# Patient Record
Sex: Male | Born: 1965 | Race: White | Hispanic: No | Smoking: Former smoker
Health system: Southern US, Community
[De-identification: ages and names within clinical notes are randomized; demographics above are authoritative.]

## PROBLEM LIST (undated history)

## (undated) DIAGNOSIS — K219 Gastro-esophageal reflux disease without esophagitis: Secondary | ICD-10-CM

## (undated) DIAGNOSIS — K602 Anal fissure, unspecified: Secondary | ICD-10-CM

## (undated) DIAGNOSIS — F1020 Alcohol dependence, uncomplicated: Secondary | ICD-10-CM

## (undated) DIAGNOSIS — J189 Pneumonia, unspecified organism: Secondary | ICD-10-CM

## (undated) DIAGNOSIS — F419 Anxiety disorder, unspecified: Secondary | ICD-10-CM

## (undated) DIAGNOSIS — G44009 Cluster headache syndrome, unspecified, not intractable: Secondary | ICD-10-CM

## (undated) DIAGNOSIS — K2270 Barrett's esophagus without dysplasia: Secondary | ICD-10-CM

## (undated) DIAGNOSIS — I10 Essential (primary) hypertension: Secondary | ICD-10-CM

## (undated) DIAGNOSIS — E538 Deficiency of other specified B group vitamins: Secondary | ICD-10-CM

## (undated) HISTORY — DX: Cluster headache syndrome, unspecified, not intractable: G44.009

## (undated) HISTORY — DX: Essential (primary) hypertension: I10

## (undated) HISTORY — PX: COCHLEAR IMPLANT: SUR684

## (undated) HISTORY — DX: Anal fissure, unspecified: K60.2

## (undated) HISTORY — DX: Anxiety disorder, unspecified: F41.9

## (undated) HISTORY — DX: Pneumonia, unspecified organism: J18.9

## (undated) HISTORY — DX: Gastro-esophageal reflux disease without esophagitis: K21.9

## (undated) HISTORY — PX: TONSILLECTOMY: SUR1361

## (undated) HISTORY — PX: HEMORRHOID BANDING: SHX5850

## (undated) HISTORY — DX: Deficiency of other specified B group vitamins: E53.8

## (undated) HISTORY — PX: OTHER SURGICAL HISTORY: SHX169

## (undated) HISTORY — DX: Barrett's esophagus without dysplasia: K22.70

## (undated) HISTORY — DX: Alcohol dependence, uncomplicated: F10.20

## (undated) HISTORY — PX: UPPER GASTROINTESTINAL ENDOSCOPY: SHX188

---

## 2003-03-23 ENCOUNTER — Other Ambulatory Visit: Payer: Self-pay

## 2004-07-21 ENCOUNTER — Emergency Department: Payer: Self-pay | Admitting: Unknown Physician Specialty

## 2004-12-18 ENCOUNTER — Ambulatory Visit: Payer: Self-pay | Admitting: Internal Medicine

## 2005-01-05 ENCOUNTER — Ambulatory Visit: Payer: Self-pay | Admitting: Internal Medicine

## 2005-05-10 ENCOUNTER — Encounter: Payer: Self-pay | Admitting: Family Medicine

## 2005-05-10 ENCOUNTER — Ambulatory Visit: Payer: Self-pay | Admitting: Internal Medicine

## 2005-06-22 ENCOUNTER — Ambulatory Visit: Payer: Self-pay | Admitting: Internal Medicine

## 2005-09-22 ENCOUNTER — Observation Stay (HOSPITAL_COMMUNITY): Admission: EM | Admit: 2005-09-22 | Discharge: 2005-09-24 | Payer: Self-pay | Admitting: Emergency Medicine

## 2005-09-22 ENCOUNTER — Ambulatory Visit: Payer: Self-pay | Admitting: Cardiology

## 2005-09-24 ENCOUNTER — Inpatient Hospital Stay (HOSPITAL_COMMUNITY): Admission: AD | Admit: 2005-09-24 | Discharge: 2005-09-27 | Payer: Self-pay | Admitting: Psychiatry

## 2005-09-25 ENCOUNTER — Ambulatory Visit: Payer: Self-pay | Admitting: Psychiatry

## 2005-10-26 ENCOUNTER — Ambulatory Visit: Payer: Self-pay | Admitting: Internal Medicine

## 2005-10-31 ENCOUNTER — Emergency Department (HOSPITAL_COMMUNITY): Admission: EM | Admit: 2005-10-31 | Discharge: 2005-10-31 | Payer: Self-pay | Admitting: Emergency Medicine

## 2005-11-10 ENCOUNTER — Ambulatory Visit: Payer: Self-pay | Admitting: Internal Medicine

## 2005-11-29 ENCOUNTER — Ambulatory Visit: Payer: Self-pay | Admitting: Internal Medicine

## 2005-12-01 ENCOUNTER — Ambulatory Visit: Payer: Self-pay | Admitting: Internal Medicine

## 2006-03-04 ENCOUNTER — Ambulatory Visit: Payer: Self-pay | Admitting: Internal Medicine

## 2006-04-28 ENCOUNTER — Ambulatory Visit: Payer: Self-pay | Admitting: Internal Medicine

## 2006-05-20 ENCOUNTER — Ambulatory Visit: Payer: Self-pay

## 2006-05-23 ENCOUNTER — Ambulatory Visit: Payer: Self-pay | Admitting: Internal Medicine

## 2006-09-27 ENCOUNTER — Ambulatory Visit: Payer: Self-pay | Admitting: Internal Medicine

## 2006-10-05 ENCOUNTER — Ambulatory Visit (HOSPITAL_COMMUNITY): Admission: RE | Admit: 2006-10-05 | Discharge: 2006-10-06 | Payer: Self-pay | Admitting: Otolaryngology

## 2006-11-03 DIAGNOSIS — F410 Panic disorder [episodic paroxysmal anxiety] without agoraphobia: Secondary | ICD-10-CM

## 2006-11-03 DIAGNOSIS — F1020 Alcohol dependence, uncomplicated: Secondary | ICD-10-CM

## 2006-11-03 DIAGNOSIS — K219 Gastro-esophageal reflux disease without esophagitis: Secondary | ICD-10-CM

## 2006-11-03 DIAGNOSIS — F411 Generalized anxiety disorder: Secondary | ICD-10-CM | POA: Insufficient documentation

## 2006-11-03 DIAGNOSIS — G510 Bell's palsy: Secondary | ICD-10-CM

## 2007-04-26 ENCOUNTER — Telehealth (INDEPENDENT_AMBULATORY_CARE_PROVIDER_SITE_OTHER): Payer: Self-pay | Admitting: *Deleted

## 2007-04-27 ENCOUNTER — Ambulatory Visit: Payer: Self-pay | Admitting: Internal Medicine

## 2007-04-27 DIAGNOSIS — G252 Other specified forms of tremor: Secondary | ICD-10-CM

## 2007-04-27 DIAGNOSIS — J209 Acute bronchitis, unspecified: Secondary | ICD-10-CM

## 2007-04-27 DIAGNOSIS — G250 Essential tremor: Secondary | ICD-10-CM

## 2007-04-27 DIAGNOSIS — H9190 Unspecified hearing loss, unspecified ear: Secondary | ICD-10-CM | POA: Insufficient documentation

## 2007-05-04 LAB — CONVERTED CEMR LAB
BUN: 5 mg/dL — ABNORMAL LOW (ref 6–23)
CO2: 36 meq/L — ABNORMAL HIGH (ref 19–32)
Calcium: 9.7 mg/dL (ref 8.4–10.5)
Creatinine, Ser: 1.2 mg/dL (ref 0.4–1.5)
GFR calc Af Amer: 86 mL/min
Potassium: 4 meq/L (ref 3.5–5.1)
Sodium: 135 meq/L (ref 135–145)
TSH: 1.94 microintl units/mL (ref 0.35–5.50)

## 2007-06-08 ENCOUNTER — Telehealth: Payer: Self-pay | Admitting: Family Medicine

## 2007-08-01 ENCOUNTER — Encounter: Payer: Self-pay | Admitting: Internal Medicine

## 2007-09-18 ENCOUNTER — Emergency Department (HOSPITAL_COMMUNITY): Admission: EM | Admit: 2007-09-18 | Discharge: 2007-09-18 | Payer: Self-pay | Admitting: Emergency Medicine

## 2007-09-19 ENCOUNTER — Inpatient Hospital Stay (HOSPITAL_COMMUNITY): Admission: AD | Admit: 2007-09-19 | Discharge: 2007-09-22 | Payer: Self-pay | Admitting: Psychiatry

## 2007-09-19 ENCOUNTER — Ambulatory Visit: Payer: Self-pay | Admitting: Psychiatry

## 2007-09-25 ENCOUNTER — Other Ambulatory Visit (HOSPITAL_COMMUNITY): Admission: RE | Admit: 2007-09-25 | Discharge: 2007-11-09 | Payer: Self-pay | Admitting: Psychiatry

## 2007-12-27 ENCOUNTER — Encounter: Payer: Self-pay | Admitting: Internal Medicine

## 2008-01-22 ENCOUNTER — Ambulatory Visit: Payer: Self-pay | Admitting: Internal Medicine

## 2008-01-22 DIAGNOSIS — J189 Pneumonia, unspecified organism: Secondary | ICD-10-CM

## 2008-01-22 HISTORY — DX: Pneumonia, unspecified organism: J18.9

## 2008-01-26 ENCOUNTER — Telehealth: Payer: Self-pay | Admitting: Internal Medicine

## 2008-02-02 ENCOUNTER — Encounter: Payer: Self-pay | Admitting: Internal Medicine

## 2008-02-13 ENCOUNTER — Ambulatory Visit: Payer: Self-pay | Admitting: Internal Medicine

## 2008-02-13 DIAGNOSIS — I10 Essential (primary) hypertension: Secondary | ICD-10-CM

## 2008-07-31 ENCOUNTER — Ambulatory Visit: Payer: Self-pay | Admitting: Internal Medicine

## 2008-07-31 ENCOUNTER — Telehealth: Payer: Self-pay | Admitting: Internal Medicine

## 2008-07-31 DIAGNOSIS — J309 Allergic rhinitis, unspecified: Secondary | ICD-10-CM | POA: Insufficient documentation

## 2008-09-06 ENCOUNTER — Telehealth: Payer: Self-pay | Admitting: Internal Medicine

## 2008-09-06 ENCOUNTER — Ambulatory Visit: Payer: Self-pay | Admitting: Internal Medicine

## 2008-09-19 ENCOUNTER — Emergency Department (HOSPITAL_COMMUNITY): Admission: EM | Admit: 2008-09-19 | Discharge: 2008-09-19 | Payer: Self-pay | Admitting: Emergency Medicine

## 2008-10-21 ENCOUNTER — Encounter: Payer: Self-pay | Admitting: Internal Medicine

## 2008-10-25 ENCOUNTER — Encounter: Payer: Self-pay | Admitting: Internal Medicine

## 2008-10-28 ENCOUNTER — Telehealth: Payer: Self-pay | Admitting: Internal Medicine

## 2008-11-17 ENCOUNTER — Emergency Department (HOSPITAL_COMMUNITY): Admission: EM | Admit: 2008-11-17 | Discharge: 2008-11-17 | Payer: Self-pay | Admitting: Emergency Medicine

## 2008-11-18 ENCOUNTER — Telehealth: Payer: Self-pay | Admitting: Internal Medicine

## 2008-11-19 ENCOUNTER — Ambulatory Visit: Payer: Self-pay | Admitting: Internal Medicine

## 2008-11-19 DIAGNOSIS — R21 Rash and other nonspecific skin eruption: Secondary | ICD-10-CM | POA: Insufficient documentation

## 2008-11-19 DIAGNOSIS — M79609 Pain in unspecified limb: Secondary | ICD-10-CM | POA: Insufficient documentation

## 2009-01-28 ENCOUNTER — Telehealth: Payer: Self-pay | Admitting: Internal Medicine

## 2009-01-31 ENCOUNTER — Telehealth: Payer: Self-pay | Admitting: Internal Medicine

## 2009-02-04 ENCOUNTER — Telehealth: Payer: Self-pay | Admitting: Internal Medicine

## 2009-04-30 ENCOUNTER — Ambulatory Visit: Payer: Self-pay | Admitting: Internal Medicine

## 2009-04-30 DIAGNOSIS — Z87891 Personal history of nicotine dependence: Secondary | ICD-10-CM | POA: Insufficient documentation

## 2009-04-30 DIAGNOSIS — G5600 Carpal tunnel syndrome, unspecified upper limb: Secondary | ICD-10-CM

## 2009-04-30 DIAGNOSIS — R209 Unspecified disturbances of skin sensation: Secondary | ICD-10-CM | POA: Insufficient documentation

## 2009-05-29 ENCOUNTER — Encounter: Payer: Self-pay | Admitting: Internal Medicine

## 2009-07-02 ENCOUNTER — Telehealth: Payer: Self-pay | Admitting: Internal Medicine

## 2009-07-03 ENCOUNTER — Telehealth: Payer: Self-pay | Admitting: Internal Medicine

## 2009-08-01 ENCOUNTER — Ambulatory Visit: Payer: Self-pay | Admitting: Internal Medicine

## 2009-08-07 ENCOUNTER — Telehealth: Payer: Self-pay | Admitting: Internal Medicine

## 2009-09-17 ENCOUNTER — Telehealth (INDEPENDENT_AMBULATORY_CARE_PROVIDER_SITE_OTHER): Payer: Self-pay | Admitting: *Deleted

## 2009-09-19 ENCOUNTER — Ambulatory Visit: Payer: Self-pay | Admitting: Internal Medicine

## 2009-09-19 DIAGNOSIS — R631 Polydipsia: Secondary | ICD-10-CM

## 2009-09-19 DIAGNOSIS — E538 Deficiency of other specified B group vitamins: Secondary | ICD-10-CM

## 2009-09-19 LAB — CONVERTED CEMR LAB
Blood Glucose, Fingerstick: 106
Vit D, 25-Hydroxy: 39 ng/mL (ref 30–89)

## 2009-09-22 ENCOUNTER — Telehealth: Payer: Self-pay | Admitting: Internal Medicine

## 2009-09-22 LAB — CONVERTED CEMR LAB
ALT: 33 units/L (ref 0–53)
BUN: 14 mg/dL (ref 6–23)
Bilirubin Urine: NEGATIVE
CO2: 29 meq/L (ref 19–32)
Calcium: 9.2 mg/dL (ref 8.4–10.5)
Creatinine, Ser: 1 mg/dL (ref 0.4–1.5)
Eosinophils Absolute: 0.5 10*3/uL (ref 0.0–0.7)
Eosinophils Relative: 5 % (ref 0.0–5.0)
Glucose, Bld: 104 mg/dL — ABNORMAL HIGH (ref 70–99)
Leukocytes, UA: NEGATIVE
Lymphocytes Relative: 35.9 % (ref 12.0–46.0)
MCV: 97.6 fL (ref 78.0–100.0)
Nitrite: NEGATIVE
Platelets: 269 10*3/uL (ref 150.0–400.0)
Potassium: 3.8 meq/L (ref 3.5–5.1)
RDW: 12.9 % (ref 11.5–14.6)
Specific Gravity, Urine: >=1.03 (ref 1.000–1.030)
Total Protein: 8.1 g/dL (ref 6.0–8.3)
Urine Glucose: NEGATIVE mg/dL
pH: 6 (ref 5.0–8.0)

## 2009-09-24 ENCOUNTER — Telehealth: Payer: Self-pay | Admitting: Internal Medicine

## 2009-10-17 ENCOUNTER — Emergency Department (HOSPITAL_COMMUNITY): Admission: EM | Admit: 2009-10-17 | Discharge: 2009-10-18 | Payer: Self-pay | Admitting: Emergency Medicine

## 2009-10-20 ENCOUNTER — Telehealth: Payer: Self-pay | Admitting: Internal Medicine

## 2009-10-20 DIAGNOSIS — M25529 Pain in unspecified elbow: Secondary | ICD-10-CM

## 2009-10-21 ENCOUNTER — Encounter: Payer: Self-pay | Admitting: Internal Medicine

## 2009-10-28 ENCOUNTER — Ambulatory Visit: Payer: Self-pay | Admitting: Internal Medicine

## 2009-11-05 ENCOUNTER — Encounter: Payer: Self-pay | Admitting: Internal Medicine

## 2010-01-20 ENCOUNTER — Telehealth: Payer: Self-pay | Admitting: Internal Medicine

## 2010-02-03 ENCOUNTER — Telehealth: Payer: Self-pay | Admitting: Internal Medicine

## 2010-03-11 ENCOUNTER — Telehealth: Payer: Self-pay | Admitting: Internal Medicine

## 2010-03-11 ENCOUNTER — Encounter (INDEPENDENT_AMBULATORY_CARE_PROVIDER_SITE_OTHER): Payer: Self-pay | Admitting: *Deleted

## 2010-03-27 ENCOUNTER — Ambulatory Visit: Payer: Self-pay | Admitting: Internal Medicine

## 2010-04-03 ENCOUNTER — Encounter: Payer: Self-pay | Admitting: Internal Medicine

## 2010-04-03 ENCOUNTER — Ambulatory Visit: Payer: Self-pay | Admitting: Internal Medicine

## 2010-04-13 ENCOUNTER — Encounter: Payer: Self-pay | Admitting: Internal Medicine

## 2010-04-13 ENCOUNTER — Other Ambulatory Visit: Payer: Self-pay | Admitting: Internal Medicine

## 2010-04-13 LAB — LDL CHOLESTEROL, DIRECT: Direct LDL: 156.5 mg/dL

## 2010-04-13 LAB — CBC WITH DIFFERENTIAL/PLATELET
Basophils Absolute: 0 10*3/uL (ref 0.0–0.1)
Basophils Relative: 0.5 % (ref 0.0–3.0)
Eosinophils Absolute: 0.5 10*3/uL (ref 0.0–0.7)
Eosinophils Relative: 7.6 % — ABNORMAL HIGH (ref 0.0–5.0)
HCT: 44.9 % (ref 39.0–52.0)
Hemoglobin: 15.6 g/dL (ref 13.0–17.0)
Lymphocytes Relative: 31.5 % (ref 12.0–46.0)
Lymphs Abs: 2.2 10*3/uL (ref 0.7–4.0)
MCHC: 34.7 g/dL (ref 30.0–36.0)
MCV: 99.2 fl (ref 78.0–100.0)
Monocytes Absolute: 0.9 10*3/uL (ref 0.1–1.0)
Monocytes Relative: 12.3 % — ABNORMAL HIGH (ref 3.0–12.0)
Neutro Abs: 3.4 10*3/uL (ref 1.4–7.7)
Neutrophils Relative %: 48.1 % (ref 43.0–77.0)
Platelets: 283 10*3/uL (ref 150.0–400.0)
RBC: 4.52 Mil/uL (ref 4.22–5.81)
RDW: 12.3 % (ref 11.5–14.6)
WBC: 7.1 10*3/uL (ref 4.5–10.5)

## 2010-04-13 LAB — LIPID PANEL
Cholesterol: 229 mg/dL — ABNORMAL HIGH (ref 0–200)
HDL: 60.8 mg/dL (ref >39.00)
Total CHOL/HDL Ratio: 4
Triglycerides: 160 mg/dL — ABNORMAL HIGH (ref 0.0–149.0)
VLDL: 32 mg/dL (ref 0.0–40.0)

## 2010-04-13 LAB — BASIC METABOLIC PANEL
BUN: 13 mg/dL (ref 6–23)
CO2: 28 mEq/L (ref 19–32)
Calcium: 9.3 mg/dL (ref 8.4–10.5)
Chloride: 103 mEq/L (ref 96–112)
Creatinine, Ser: 0.9 mg/dL (ref 0.4–1.5)
GFR: 96.64 mL/min (ref >60.00)
Glucose, Bld: 87 mg/dL (ref 70–99)
Potassium: 4.4 mEq/L (ref 3.5–5.1)
Sodium: 139 mEq/L (ref 135–145)

## 2010-04-13 LAB — HEPATIC FUNCTION PANEL
ALT: 28 U/L (ref 0–53)
AST: 22 U/L (ref 0–37)
Albumin: 3.9 g/dL (ref 3.5–5.2)
Alkaline Phosphatase: 64 U/L (ref 39–117)
Bilirubin, Direct: 0.1 mg/dL (ref 0.0–0.3)
Total Bilirubin: 0.8 mg/dL (ref 0.3–1.2)
Total Protein: 7.8 g/dL (ref 6.0–8.3)

## 2010-04-13 LAB — TSH: TSH: 1.36 u[IU]/mL (ref 0.35–5.50)

## 2010-04-13 LAB — URINALYSIS
Bilirubin Urine: NEGATIVE
Hemoglobin, Urine: NEGATIVE
Ketones, ur: NEGATIVE
Leukocytes, UA: NEGATIVE
Nitrite: NEGATIVE
Specific Gravity, Urine: 1.015 (ref 1.000–1.030)
Total Protein, Urine: NEGATIVE
Urine Glucose: NEGATIVE
Urobilinogen, UA: 0.2 (ref 0.0–1.0)
pH: 7.5 (ref 5.0–8.0)

## 2010-04-13 LAB — VITAMIN B12: Vitamin B-12: 563 pg/mL (ref 211–911)

## 2010-04-13 LAB — PSA: PSA: 0.4 ng/mL (ref 0.10–4.00)

## 2010-04-30 ENCOUNTER — Telehealth: Payer: Self-pay | Admitting: Internal Medicine

## 2010-05-05 NOTE — Progress Notes (Signed)
  Phone Note Call from Patient Call back at Work Phone [phone]   Summary of Call: Pt has hemorrhoids and otc cream says to ask MD b/c he has HTN. Please advise.  Initial call taken by: Lamar Sprinkles, CMA,  Aug 07, 2009 2:21 PM  Follow-up for Phone Call        OK to use cream OV if bad Follow-up by: Tresa Garter MD,  Aug 07, 2009 4:06 PM  Additional Follow-up for Phone Call Additional follow up Details #1::        left mess to call office back................Marland KitchenLamar Sprinkles, CMA  Aug 07, 2009 4:08 PM     Additional Follow-up for Phone Call Additional follow up Details #2::    pt informed that per MD it is okay to use OTC cream and if it does not help or sxs get worse, contact our office for an appt. Follow-up by: Margaret Pyle, CMA,  Aug 08, 2009 8:59 AM

## 2010-05-05 NOTE — Progress Notes (Signed)
Summary: NEEDS OV  ---- Converted from flag ---- ---- 03/11/2010 10:41 AM, Verdell Face wrote: mailed letter.  ---- 03/02/2010 11:14 AM, Verdell Face wrote: left message to call for appt.  ---- 03/02/2010 10:05 AM, Lanier Prude, CMA(AAMA) wrote: Please sched pt OV with PCP.   Thanks!! ------------------------------

## 2010-05-05 NOTE — Letter (Signed)
Summary: Donald Prose MD  Donald Prose MD   Imported By: Lester Logan [place] 11/18/2009 09:17:19  _____________________________________________________________________  External Attachment:    Type:   Image     Comment:   External Document

## 2010-05-05 NOTE — Progress Notes (Signed)
Summary: azor  Phone Note Call from Patient Call back at [employer] [phone] Call back at Work Phone [phone]   Summary of Call: Patient left message on triage that insurance will not cover Azor, send 90 day supply to his pharmacy. I left message on voicemail at work to call the office with more info--?need prior authorization, im sure that if they wont pay for 30day then they wont pay for 90 day. Initial call taken by: Lucious Groves,  July 03, 2009 3:32 PM  Follow-up for Phone Call        lmovm for pt to call office for more information. Follow-up by: Ami Bullins CMA,  July 04, 2009 9:26 AM  Additional Follow-up for Phone Call Additional follow up Details #1::        Pt called back, he needs bp med [facility] Additional Follow-up by: Lamar Sprinkles, CMA,  July 04, 2009 11:04 AM    Prescriptions: AZOR 10-40 MG TABS (AMLODIPINE-OLMESARTAN) 1 by mouth once daily for blood pressure  #90 x 3   Entered by:   Lamar Sprinkles, CMA   Authorized by:   Tresa Garter MD   Signed by:   Lamar Sprinkles, CMA on 07/04/2009   Method used:   [facility] to        [facility] [address] [phone]* (retail)       [address]       [place], [place]  [address]       Ph: [phone] or [phone]       Fax: [phone]   RxID:   [id number]   Appended Document: azor Rtn call to patient, Left message on voicemail notifying patient prescription has been corrected.

## 2010-05-05 NOTE — Progress Notes (Signed)
Summary: Anxiety  Phone Note Other Incoming   Caller: pt  Summary of Call: Pt calling and states that his anxiety level is still about the same. He states that he takes 1/2 alprazalom two times a day (he cannot take a whole while he is working) What do you advise? Initial call taken by: Ami Bullins CMA,  September 24, 2009 1:48 PM  Follow-up for Phone Call        It could be a sinus infection - take Zpac Follow-up by: Tresa Garter MD,  September 24, 2009 5:16 PM  Additional Follow-up for Phone Call Additional follow up Details #1::        ?what does z-pak have to do with pt anxiety? Called [phone], and was notified that the office has the wrong number. Left message on voicemail at work to call back to office.  I'm sorry, z-pak for anxiety? MD pleease advise.  Additional Follow-up by: Lucious Groves,  September 25, 2009 3:27 PM    Additional Follow-up for Phone Call Additional follow up Details #2::    Zpac is for sinusitis that may be responsible for fatigue that he was here for last time He can try Buspar for anxiety Follow-up by: Tresa Garter MD,  September 26, 2009 12:41 PM  Additional Follow-up for Phone Call Additional follow up Details #3:: Details for Additional Follow-up Action Taken: Patient notfiied. Additional Follow-up by: Lucious Groves,  September 26, 2009 2:22 PM  New/Updated Medications: ZITHROMAX Z-PAK 250 MG TABS (AZITHROMYCIN) as dirrected BUSPIRONE HCL 15 MG TABS (BUSPIRONE HCL) Start with 1/2 tab two times a day then 1 by mouth two times a day for anxiety  Prescriptions: BUSPIRONE HCL 15 MG TABS (BUSPIRONE HCL) Start with 1/2 tab two times a day then 1 by mouth two times a day for anxiety  #60 x 6   Entered and Authorized by:   Tresa Garter MD   Signed by:   Lucious Groves on 09/26/2009   Method used:   Electronically to        [facility] [address] [phone]* (retail)       [address]       [place], [place]  [address]       Ph:  [phone] or [phone]       Fax: [phone]   RxID:   [id number] ZITHROMAX Z-PAK 250 MG TABS (AZITHROMYCIN) as dirrected  #1 x 0   Entered by:   Lucious Groves   Authorized by:   Tresa Garter MD   Signed by:   Lucious Groves on 09/25/2009   Method used:   Electronically to        [facility]  [facility] [address] [phone]* (retail)       [address]       [place], [place]  [address]       Ph: [phone] or [phone]       Fax: [phone]   RxID:   [id number]

## 2010-05-05 NOTE — Progress Notes (Signed)
Summary: Buspirone 90 day supply  Phone Note From Pharmacy Message from:  Fax from Pharmacy  Call[facility] [address] [phone]* Summary of Call: rec fax request from pharm requesting   Buspirone Hcl 15mg  1 tablet two times a day for a 90 day supply.  This med was just RF in 09/2009 with 6 additional Rf.  Please advise on 90 days supply Initial call taken by: Lanier Prude, [facility]),  February 03, 2010 4:42 PM  Follow-up for Phone Call        ok 90 d Follow-up by: Tresa Garter MD,  February 03, 2010 5:20 PM    New/Updated Medications: BUSPIRONE HCL 15 MG TABS (BUSPIRONE HCL) 1 by mouth two times a day for anxiety Prescriptions: BUSPIRONE HCL 15 MG TABS (BUSPIRONE HCL) 1 by mouth two times a day for anxiety  #180 x 3   Entered and Authorized by:   Tresa Garter MD   Signed by:   Lanier Prude, [facility]) on 02/04/2010   Method used:   Electronically to        [facility]  [facility] [address] [phone]* (retail)       [address]       [place], [place]  [address]       Ph: [phone] or [phone]       Fax: [phone]   RxID:   [id number]

## 2010-05-05 NOTE — Assessment & Plan Note (Signed)
Summary: NUMBNESS IN HAND WHEN WAKES UP/ SOB / ANXIETY/ NWS   Vital Signs:  Patient profile:   45 year old male Weight:      204 pounds BMI:     31.13 Temp:     98.3 degrees F oral Pulse rate:   83 / minute BP sitting:   156 / 96  (left arm)  Vitals Entered By: Tora Perches (April 30, 2009 1:16 PM) CC: numbness in hand, sob, anxiety Is Patient Diabetic? No   CC:  numbness in hand, sob, and anxiety.  History of Present Illness: C/o anxiety and panic attaks C/o pain in chest and neck C/O NUMBNESS IN L HAND WHEN GOING TO SLEEP  Preventive Screening-Counseling & Management  Alcohol-Tobacco     Smoking Status: quit  Allergies: 1)  ! Prozac (Fluoxetine Hcl) 2)  ! Nexium (Esomeprazole Magnesium) 3)  ! Prilosec (Omeprazole)  Past History:  Past Medical History: Last updated: 11/19/2008 Anxiety w/panic attacks GERD Alcoholism Hypertension Allergic rhinitis H zoster (?) L arm 2010  Past Surgical History: Last updated: 04/27/2007 Cochlear implant   Family History: Last updated: 04/27/2007 Family History Hypertension  Social History: Last updated: 07/31/2008 Occupation:[employer] Single separated Former Smoker Alcohol use-yes  Physical Exam  General:  NAD  Nose:  nasal dischargemucosal pallor and mucosal erythema.   Mouth:  Erythematous throat mucosa and intranasal erythema.  Neck:  supple and full ROM.   Lungs:  CTA Heart:  tachy Abdomen:  Bowel sounds positive,abdomen soft and non-tender without masses, organomegaly or hernias noted. Msk:  Hypersensitive ove prox R lat shoulder Extremities:  no edema, no ulcers  Neurologic:  Mild tremor present in UE CTS signs (-) Psych:  Oriented X3 and slightly anxious.     Impression & Recommendations:  Problem # 1:  CARPAL TUNNEL SYNDROME (ICD-354.0) L Assessment New  Orders: Splints- All Types (Z6109)  Problem # 2:  PARESTHESIA (ICD-782.0) L due to #1 Assessment: New  Problem # 3:  HYPERTENSION  (ICD-401.9) Assessment: Deteriorated  His updated medication list for this problem includes:    Azor 10-20 Mg Tabs (Amlodipine-olmesartan) .Marland Kitchen... 1 by mouth once daily Risks of noncompliance with treatment discussed. Compliance encouraged.  Problem # 4:  ANXIETY (ICD-300.00) Assessment: Deteriorated  His updated medication list for this problem includes:    Fluvoxamine Maleate 100 Mg Tabs (Fluvoxamine maleate) .Marland Kitchen... 200 mg once daily    Alprazolam 0.5 Mg Tabs (Alprazolam) .Marland Kitchen... 1 by mouth two times a day as needed anxiety  Problem # 5:  ALCOHOLISM (ICD-303.90) Assessment: Unchanged Discussed again  Complete Medication List: 1)  Fluvoxamine Maleate 100 Mg Tabs (Fluvoxamine maleate) .... 200 mg once daily 2)  Pantoprazole Sodium 40 Mg Tbec (Pantoprazole sodium) .Marland Kitchen.. 1 by mouth once daily stop aciphex 3)  B-1 100 Mg Tabs (Thiamine hcl) .Marland Kitchen.. 1 by mouth qd 4)  Alprazolam 0.5 Mg Tabs (Alprazolam) .Marland Kitchen.. 1 by mouth two times a day as needed anxiety 5)  Vitamin D3 1000 Unit Tabs (Cholecalciferol) .Marland Kitchen.. 1 by mouth daily 6)  Naproxen 500 Mg Tabs (Naproxen) .Marland Kitchen.. 1 by mouth two times a day pc for pain/arthritis as needed 7)  Azor 10-40 Mg Tabs (Amlodipine-olmesartan) .Marland Kitchen.. 1 by mouth once daily for blood pressure  Other Orders: Admin 1st Vaccine (60454) Flu Vaccine 61yrs + (09811)  Patient Instructions: 1)  Please schedule a follow-up appointment in 3 months. 2)  BMP prior to visit, ICD-9: 3)  Hepatic Panel prior to visit, ICD-9: 4)  TSH prior to visit, ICD-9:  5)  ESR Vit B12 266.20 995.20 6)  Contour pillow 7)  Use stretching exercises that I have provided (15 min. or longer every day)  Prescriptions: AZOR 10-40 MG TABS (AMLODIPINE-OLMESARTAN) 1 by mouth once daily for blood pressure  #30 x 12   Entered and Authorized by:   Tresa Garter MD   Signed by:   Tresa Garter MD on 04/30/2009   Method used:   Print then Give to Patient   RxID:   [id number] NAPROXEN 500 MG TABS  (NAPROXEN) 1 by mouth two times a day pc for pain/arthritis as needed  #60 x 3   Entered and Authorized by:   Tresa Garter MD   Signed by:   Tresa Garter MD on 04/30/2009   Method used:   Print then Give to Patient   RxID:   [id number] ALPRAZOLAM 0.5 MG  TABS (ALPRAZOLAM) 1 by mouth two times a day as needed anxiety  #60 x 6   Entered and Authorized by:   Tresa Garter MD   Signed by:   Tresa Garter MD on 04/30/2009   Method used:   Print then Give to Patient   RxID:   [id number] AZOR 10-20 MG TABS (AMLODIPINE-OLMESARTAN) 1 by mouth qd  #30 x 12   Entered and Authorized by:   Tresa Garter MD   Signed by:   Tresa Garter MD on 04/30/2009   Method used:   Print then Give to Patient   RxID:   [id number] FLUVOXAMINE MALEATE 100 MG  TABS (FLUVOXAMINE MALEATE) 200 mg once daily  #60 x 6   Entered and Authorized by:   Tresa Garter MD   Signed by:   Tresa Garter MD on 04/30/2009   Method used:   Print then Give to Patient   RxID:   [id number]    Influenza Vaccine (to be given today)   Flu Vaccine Consent Questions     Do you have a history of severe allergic reactions to this vaccine? no    Any prior history of allergic reactions to egg and/or gelatin? no    Do you have a sensitivity to the preservative Thimersol? no    Do you have a past history of Guillan-Barre Syndrome? no    Do you currently have an acute febrile illness? no    Have you ever had a severe reaction to latex? no    Vaccine information given and explained to patient? yes    Are you currently pregnant? no    Lot Number:[id number]   Exp Date:10/02/2009   Site Given  Left Deltoid IMbflu

## 2010-05-05 NOTE — Letter (Signed)
Summary: Primary Care Appointment Letter  [facility]  [address] [place], [place] [address]   Phone: [phone]  Fax: [phone]    03/11/2010 MRN: [id number]  [facility] [address] [place], [place]  [address]  Dear Howard Gray,   Your Primary Care Physician Tresa Garter MD has indicated that:    ___XX____it is time to schedule an appointment for follow up.    _______you missed your appointment on______ and need to call and          reschedule.    _______you need to have lab work done.    _______you need to schedule an appointment discuss lab or test results.    _______you need to call to reschedule your appointment that is                       scheduled on _________.     Please call our office as soon as possible. Our phone number is [phone] option #1. Please press option 1. Our office is open 8a-12noon and 1p-5p, Monday through Friday.     Thank you,     Primary Care Scheduler

## 2010-05-05 NOTE — Letter (Signed)
Summary: On-Call Note /  Nature conservation officer  On-Call Note /  Nature conservation officer   Imported By: Lennie Odor 09/18/2009 15:31:30  _____________________________________________________________________  External Attachment:    Type:   Image     Comment:   External Document

## 2010-05-05 NOTE — Letter (Signed)
Summary: [facility]  [facility]   Imported By: Sherian Rein 11/04/2009 14:01:01  _____________________________________________________________________  External Attachment:    Type:   Image     Comment:   External Document

## 2010-05-05 NOTE — Progress Notes (Signed)
Summary: med refill  Phone Note Refill Request Message from:  Patient on July 02, 2009 3:17 PM  Refills Requested: Medication #1:  FLUVOXAMINE MALEATE 100 MG  TABS 200 mg once daily Initial call taken by: Lucious Groves,  July 02, 2009 3:17 PM  Follow-up for Phone Call        I made patient aware that he has refills and pharmacy has been contacted. I left message on voicemail notifying pharmacy to fill this prescription, new request is not needed at this time. Follow-up by: Lucious Groves,  July 02, 2009 3:17 PM

## 2010-05-05 NOTE — Progress Notes (Signed)
Summary: Needs ov   Phone Note Call from Patient   Summary of Call: Pt c/o increase in fatigue and loss of  mental focus. left mess to call office back and schedule office visit.  Initial call taken by: Lamar Sprinkles, CMA,  September 17, 2009 11:12 AM  Follow-up for Phone Call        OV this wk Follow-up by: Tresa Garter MD,  September 17, 2009 1:21 PM  Additional Follow-up for Phone Call Additional follow up Details #1::        Appt sched for 6/17 @4 :15 p.m. Additional Follow-up by: Verdell Face,  September 17, 2009 1:38 PM
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
## 2010-05-05 NOTE — Progress Notes (Signed)
Summary: RESULTS  Phone Note Call from Patient Call back at Work Phone [phone] Call back at [phone]   Summary of Call: Patient is requesting results of labs.  Initial call taken by: Lamar Sprinkles, CMA,  September 22, 2009 9:39 AM  Follow-up for Phone Call        Labs OK except for borderline low Vit B12. Take by mouth Vit B12 - see meds. Check Vit B12 and BMET prior to next appt 266.20 Follow-up by: Tresa Garter MD,  September 22, 2009 1:26 PM  Additional Follow-up for Phone Call Additional follow up Details #1::        Pt informed, transferred to schedulers for f/u Additional Follow-up by: Lamar Sprinkles, CMA,  September 22, 2009 3:26 PM

## 2010-05-05 NOTE — Progress Notes (Signed)
  Phone Note Call from Patient   Caller: [name] Summary of Call: rec vm from [place] stating she is pts wife (but currently seperated) she is requesting #180 for 90 day supply of Fluvoxamine.  He takes 100mg  2 qd. She states the pt killed their family pet last time that he ran out of med and is in extreme need of this med.  [facility] [address]  I tried calling pt at numbers listed above to verify if pt really needs/wants this request and no answer/vm..........Marland Kitchenwill retry later.  pt cell # [phone] Initial call taken by: Lanier Prude, [facility]),  January 20, 2010 11:09 AM  Follow-up for Phone Call        pt informed rf sent to [facility] [address] Follow-up by: Lanier Prude, [facility]),  January 20, 2010 2:39 PM    Prescriptions: FLUVOXAMINE MALEATE 100 MG  TABS (FLUVOXAMINE MALEATE) 200 mg once daily  #180 x 1   Entered by:   Lanier Prude, CMA(AAMA)   Authorized by:   Tresa Garter MD   Signed by:   Lanier Prude, CMA(AAMA) on 01/20/2010   Method used:   Electronically to        [facility]  [address] [phone]* (retail)       [address]       [place], [place]  [address]       Ph: [phone] or [phone]       Fax: [phone]   RxID:   [id number]

## 2010-05-05 NOTE — Miscellaneous (Signed)
Summary: lansoprazole  Clinical Lists Changes  Medications: Added new medication of LANSOPRAZOLE 30 MG CPDR (LANSOPRAZOLE) 1 by mouth once daily - Signed Rx of LANSOPRAZOLE 30 MG CPDR (LANSOPRAZOLE) 1 by mouth once daily;  #30 x 6;  Signed;  Entered by: Tora Perches;  Authorized by: Tresa Garter MD;  Method used: Electronically to [facility]  [facility] [address] [phone]*, [address], [place], [place], [place]  [address], Ph: [phone] or [phone], Fax: [phone]    Prescriptions: LANSOPRAZOLE 30 MG CPDR (LANSOPRAZOLE) 1 by mouth once daily  #30 x 6   Entered by:   Tora Perches   Authorized by:   Tresa Garter MD   Signed by:   Tora Perches on 05/29/2009   Method used:   Electronically to        [facility]  [facility] [address] [phone]* (retail)       [address]       [place], [place]  [address]       Ph: [phone] or [phone]       Fax: [phone]   RxID:   [id number]

## 2010-05-05 NOTE — Assessment & Plan Note (Signed)
Summary: FATIGUE/LOSS OF MENTAL FOCUS/CD   Vital Signs:  Patient profile:   45 year old male Height:      68 inches (172.72 cm) Weight:      203 pounds (92.27 kg) O2 Sat:      97 % on Room air Temp:     99.0 degrees F (37.22 degrees C) oral Pulse rate:   85 / minute BP sitting:   124 / 88  (left arm) Cuff size:   large  Vitals Entered By: Lucious Groves (September 19, 2009 4:28 PM)  O2 Flow:  Room air CC: C/O fatigue and loss of focus x1 month./kb Is Patient Diabetic? No Pain Assessment Patient in pain? no      CBG Result 106 Comments Patient notes that he is not taking Naproxen./kb   CC:  C/O fatigue and loss of focus x1 month./kb.  History of Present Illness: C/o fatigue and dizziness at hs; thirsty C/o twinges   Current Medications (verified): 1)  Fluvoxamine Maleate 100 Mg  Tabs (Fluvoxamine Maleate) .... 200 Mg Once Daily 2)  Pantoprazole Sodium 40 Mg Tbec (Pantoprazole Sodium) .Marland Kitchen.. 1 By Mouth Once Daily Stop Aciphex 3)  B-1 100 Mg Tabs (Thiamine Hcl) .Marland Kitchen.. 1 By Mouth Qd 4)  Alprazolam 0.5 Mg  Tabs (Alprazolam) .Marland Kitchen.. 1 By Mouth Two Times A Day As Needed Anxiety 5)  Vitamin D3 1000 Unit  Tabs (Cholecalciferol) .Marland Kitchen.. 1 By Mouth Daily 6)  Naproxen 500 Mg Tabs (Naproxen) .Marland Kitchen.. 1 By Mouth Two Times A Day Pc For Pain/arthritis As Needed 7)  Azor 10-40 Mg Tabs (Amlodipine-Olmesartan) .Marland Kitchen.. 1 By Mouth Once Daily For Blood Pressure 8)  Lansoprazole 30 Mg Cpdr (Lansoprazole) .Marland Kitchen.. 1 By Mouth Once Daily  Allergies (verified): 1)  ! Prozac (Fluoxetine Hcl) 2)  ! Nexium (Esomeprazole Magnesium) 3)  ! Prilosec (Omeprazole)  Past History:  Social History: Last updated: 07/31/2008 Occupation:[employer] Single separated Former Smoker Alcohol use-yes  Past Medical History: Anxiety w/panic attacks GERD Alcoholism Hypertension Allergic rhinitis H zoster (?) L arm 2010 Low Vit B12 2011  Review of Systems       The patient complains of depression.  The patient denies fever, chest  pain, syncope, abdominal pain, and melena.         Tired  Physical Exam  General:  NAD  Head:  Normocephalic and atraumatic without obvious abnormalities. No apparent alopecia or balding. Eyes:  No corneal or conjunctival inflammation noted. EOMI. Perrla. Funduscopic exam benign, without hemorrhages, exudates or papilledema. Vision grossly normal. Ears:  hard hearing Mouth:  Oral mucosa and oropharynx without lesions or exudates.  Teeth in good repair. Neck:  No deformities, masses, or tenderness noted. Lungs:  Normal respiratory effort, chest expands symmetrically. Lungs are clear to auscultation, no crackles or wheezes. Heart:  tachy Abdomen:  Bowel sounds positive,abdomen soft and non-tender without masses, organomegaly or hernias noted. Msk:  WNL Neurologic:  Mild tremor present in UE CTS signs (-) Skin:  1 blister on R arm Psych:  Oriented X3 and slightly anxious.  not suicidal and not homicidal.     Impression & Recommendations:  Problem # 1:  POLYDIPSIA (ICD-783.5) Assessment New  Orders: Capillary Blood Glucose/CBG [phone]) T-Vitamin D (25-Hydroxy) [phone]) TLB-B12, Serum-Total ONLY (91478-G95) TLB-BMP (Basic Metabolic Panel-BMET) (80048-METABOL) TLB-CBC Platelet - w/Differential (85025-CBCD) TLB-Hepatic/Liver Function Pnl (80076-HEPATIC) TLB-Sedimentation Rate (ESR) (85652-ESR) TLB-TSH (Thyroid Stimulating Hormone) (84443-TSH) TLB-Udip ONLY (81003-UDIP)  Problem # 2:  FATIGUE (ICD-780.79)  Problem # 3:  HYPERTENSION (ICD-401.9)  His updated medication  list for this problem includes:    Azor 10-40 Mg Tabs (Amlodipine-olmesartan) .Marland Kitchen... 1 by mouth once daily for blood pressure  Problem # 4:  PANIC ATTACK (ICD-300.01)  His updated medication list for this problem includes:    Fluvoxamine Maleate 100 Mg Tabs (Fluvoxamine maleate) .Marland Kitchen... 200 mg once daily    Alprazolam 0.5 Mg Tabs (Alprazolam) .Marland Kitchen... 1 by mouth two times a day as needed anxiety  Problem # 5:   VITAMIN B12 DEFICIENCY (ICD-266.2) Assessment: New See meds  Complete Medication List: 1)  Fluvoxamine Maleate 100 Mg Tabs (Fluvoxamine maleate) .... 200 mg once daily 2)  Pantoprazole Sodium 40 Mg Tbec (Pantoprazole sodium) .Marland Kitchen.. 1 by mouth once daily stop aciphex 3)  B-1 100 Mg Tabs (Thiamine hcl) .Marland Kitchen.. 1 by mouth qd 4)  Alprazolam 0.5 Mg Tabs (Alprazolam) .Marland Kitchen.. 1 by mouth two times a day as needed anxiety 5)  Vitamin D3 1000 Unit Tabs (Cholecalciferol) .Marland Kitchen.. 1 by mouth daily 6)  Naproxen 500 Mg Tabs (Naproxen) .Marland Kitchen.. 1 by mouth two times a day pc for pain/arthritis as needed 7)  Azor 10-40 Mg Tabs (Amlodipine-olmesartan) .Marland Kitchen.. 1 by mouth once daily for blood pressure 8)  Lansoprazole 30 Mg Cpdr (Lansoprazole) .Marland Kitchen.. 1 by mouth once daily 9)  Vitamin B-12 500 Mcg Tabs (Cyanocobalamin) .Marland Kitchen.. 1 by mouth once daily for vitamin b12 deficiency  Patient Instructions: 1)  Call if you are not better in a reasonable amount of time or if worse 2)  Please schedule a follow-up appointment in 4-6 weeks w/Vit B12 266.20. Prescriptions: VITAMIN B-12 500 MCG TABS (CYANOCOBALAMIN) 1 by mouth once daily for Vitamin B12 deficiency  #30 x 12   Entered and Authorized by:   Tresa Garter MD   Signed by:   Tresa Garter MD on 09/22/2009   Method used:   [facility]        [facility] [address] [address] [phone]* (retail)       [address]       [place], [place]  [address]       Ph: [phone] or [phone]       Fax: [phone]   RxID:   [id number] AZOR 10-40 MG TABS (AMLODIPINE-OLMESARTAN) 1 by mouth once daily for blood pressure  #90 x 3   Entered and Authorized by:   Tresa Garter MD   Signed by:   Tresa Garter MD on 09/19/2009   Method used:   [facility]        [facility] [address] [phone]* (retail)       [address]       [place], [place]  [address]       Ph: [phone] or [phone]       Fax: [phone]   RxID:    [id number] FLUVOXAMINE MALEATE 100 MG  TABS (FLUVOXAMINE MALEATE) 200 mg once daily  #60 x 6   Entered and Authorized by:   Tresa Garter MD   Signed by:   Tresa Garter MD on 09/19/2009   Method used:   [facility]        [facility] [address] [phone]* (retail)       [address]       [place], [place]  [address]       Ph: [phone] or [phone]  Fax: [phone]   RxID:   [id number]

## 2010-05-06 ENCOUNTER — Encounter: Payer: Self-pay | Admitting: Internal Medicine

## 2010-05-07 NOTE — Progress Notes (Signed)
Summary: LOST MEDS  Phone Note Call from Patient Call back at Home Phone [phone]   Summary of Call: Pt's wife dropped off a note - pt lost his bottle of xanax and req early refill.  Initial call taken by: Lamar Sprinkles, CMA,  April 30, 2010 12:28 PM  Follow-up for Phone Call        ok to ref early - do not loose meds; we will not be able to ref it  again Thank you!  Follow-up by: Tresa Garter MD,  April 30, 2010 12:50 PM  Additional Follow-up for Phone Call Additional follow up Details #1::        [facility] to advise of [facility]ill/ told pt only fills azor there and may need to call [facility] and [address]. Patient uses multiple phamacies. Do you want to check DEA website?    Additional Follow-up for Phone Call Additional follow up Details #2::    Yes, please. However, I would be surprised if he is getting Xanax from other doctors. Thank you!  Follow-up by: Tresa Garter MD,  May 01, 2010 1:32 PM  Additional Follow-up for Phone Call Additional follow up Details #3:: Details for Additional Follow-up Action Taken: Called in rx - Pt informed  Additional Follow-up by: Lamar Sprinkles, CMA,  May 01, 2010 5:03 PM  Prescriptions: ALPRAZOLAM 0.5 MG  TABS (ALPRAZOLAM) 1 by mouth two times a day as needed anxiety  #60 x 3   Entered by:   Lamar Sprinkles, CMA   Authorized by:   Tresa Garter MD   Signed by:   Lamar Sprinkles, CMA on 05/01/2010   Method used:   Telephoned to ...       [facility]  [address] [phone]* (retail)       [address]       [place], [place]  [id number]       Ph: [phone] or [phone]       Fax: [phone]   RxID:   [id number]

## 2010-05-07 NOTE — Assessment & Plan Note (Signed)
Summary: cpx-lb   Vital Signs:  Patient profile:   45 year old male Height:      68 inches Weight:      201 pounds BMI:     30.67 Temp:     98.6 degrees F oral Pulse rate:   76 / minute Pulse rhythm:   regular Resp:     16 per minute BP sitting:   130 / 98  (left arm) Cuff size:   regular  Vitals Entered By: Lanier Prude, Beverly Gust) (April 03, 2010 3:11 PM) CC: CPX Is Patient Diabetic? No Comments pt is not taking B-1, Vit D or Naproxen   CC:  CPX.  History of Present Illness: The patient presents for a preventive health examination   Current Medications (verified): 1)  Fluvoxamine Maleate 100 Mg  Tabs (Fluvoxamine Maleate) .... 200 Mg Once Daily 2)  Pantoprazole Sodium 40 Mg Tbec (Pantoprazole Sodium) .Marland Kitchen.. 1 By Mouth Once Daily Stop Aciphex 3)  B-1 100 Mg Tabs (Thiamine Hcl) .Marland Kitchen.. 1 By Mouth Qd 4)  Alprazolam 0.5 Mg  Tabs (Alprazolam) .Marland Kitchen.. 1 By Mouth Two Times A Day As Needed Anxiety 5)  Vitamin D3 1000 Unit  Tabs (Cholecalciferol) .Marland Kitchen.. 1 By Mouth Daily 6)  Naproxen 500 Mg Tabs (Naproxen) .Marland Kitchen.. 1 By Mouth Two Times A Day Pc For Pain/arthritis As Needed 7)  Azor 10-40 Mg Tabs (Amlodipine-Olmesartan) .Marland Kitchen.. 1 By Mouth Once Daily For Blood Pressure 8)  Lansoprazole 30 Mg Cpdr (Lansoprazole) .Marland Kitchen.. 1 By Mouth Once Daily 9)  Vitamin B-12 500 Mcg Tabs (Cyanocobalamin) .Marland Kitchen.. 1 By Mouth Once Daily For Vitamin B12 Deficiency 10)  Buspirone Hcl 15 Mg Tabs (Buspirone Hcl) .Marland Kitchen.. 1 By Mouth Two Times A Day For Anxiety  Allergies (verified): 1)  ! Prozac (Fluoxetine Hcl) 2)  ! Nexium (Esomeprazole Magnesium) 3)  ! Prilosec (Omeprazole)  Past History:  Past Medical History: Last updated: 09/19/2009 Anxiety w/panic attacks GERD Alcoholism Hypertension Allergic rhinitis H zoster (?) L arm 2010 Low Vit B12 2011  Past Surgical History: Last updated: 04/27/2007 Cochlear implant   Family History: Last updated: 04/27/2007 Family History Hypertension  Social  History: Occupation:[employer] separated - 1 son Former Smoker Alcohol use-yes  Review of Systems       The patient complains of breast masses.  The patient denies anorexia, fever, weight loss, weight gain, vision loss, decreased hearing, hoarseness, chest pain, syncope, dyspnea on exertion, peripheral edema, prolonged cough, headaches, hemoptysis, abdominal pain, melena, hematochezia, severe indigestion/heartburn, hematuria, incontinence, genital sores, muscle weakness, suspicious skin lesions, transient blindness, difficulty walking, depression, unusual weight change, abnormal bleeding, enlarged lymph nodes, angioedema, and testicular masses.    Physical Exam  General:  NAD  Head:  Normocephalic and atraumatic without obvious abnormalities. No apparent alopecia or balding. Eyes:  No corneal or conjunctival inflammation noted. EOMI. Perrla. Funduscopic exam benign, without hemorrhages, exudates or papilledema. Vision grossly normal. Ears:  hard hearing Nose:  nasal dischargemucosal pallor and mucosal erythema.   Mouth:  Oral mucosa and oropharynx without lesions or exudates.  Teeth in good repair. Neck:  No deformities, masses, or tenderness noted. Lungs:  Normal respiratory effort, chest expands symmetrically. Lungs are clear to auscultation, no crackles or wheezes. Heart:  tachy Abdomen:  Bowel sounds positive,abdomen soft and non-tender without masses, organomegaly or hernias noted. Genitalia:  declined Msk:  WNL Extremities:  no edema, no ulcers  Neurologic:  Mild tremor present in UE CTS signs (-) Skin:  1 blister on R arm Psych:  Oriented X3  and not anxious.  not suicidal and not homicidal.     Impression & Recommendations:  Problem # 1:  ROUTINE GENERAL MEDICAL EXAM@HEALTH  CARE FACL (ICD-V70.0) Assessment New Health and age related issues were discussed. Available screening tests and vaccinations were discussed as well. Healthy life style including good diet and exercise was  discussed.  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 200 mg once daily    Alprazolam 0.5 Mg Tabs (Alprazolam) .Marland Kitchen... 1 by mouth two times a day as needed anxiety    Buspirone Hcl 15 Mg Tabs (Buspirone hcl) .Marland Kitchen... 1 by mouth two times a day for anxiety  Problem # 3:  VITAMIN B12 DEFICIENCY (ICD-266.2) Assessment: Unchanged On the regimen of medicine(s) reflected in the chart    Problem # 4:  ALCOHOLISM (ICD-303.90) Assessment: Improved  Complete Medication List: 1)  Fluvoxamine Maleate 100 Mg Tabs (Fluvoxamine maleate) .... 200 mg once daily 2)  Pantoprazole Sodium 40 Mg Tbec (Pantoprazole sodium) .Marland Kitchen.. 1 by mouth once daily stop aciphex 3)  B-1 100 Mg Tabs (Thiamine hcl) .Marland Kitchen.. 1 by mouth qd 4)  Alprazolam 0.5 Mg Tabs (Alprazolam) .Marland Kitchen.. 1 by mouth two times a day as needed anxiety 5)  Vitamin D3 1000 Unit Tabs (Cholecalciferol) .Marland Kitchen.. 1 by mouth daily 6)  Naproxen 500 Mg Tabs (Naproxen) .Marland Kitchen.. 1 by mouth two times a day pc for pain/arthritis as needed 7)  Azor 10-40 Mg Tabs (Amlodipine-olmesartan) .Marland Kitchen.. 1 by mouth once daily for blood pressure 8)  Lansoprazole 30 Mg Cpdr (Lansoprazole) .Marland Kitchen.. 1 by mouth once daily 9)  Vitamin B-12 500 Mcg Tabs (Cyanocobalamin) .Marland Kitchen.. 1 by mouth once daily for vitamin b12 deficiency 10)  Buspirone Hcl 15 Mg Tabs (Buspirone hcl) .Marland Kitchen.. 1 by mouth two times a day for anxiety  Other Orders: Flu Vaccine 2yrs + (16109) Admin 1st Vaccine (60454)  Patient Instructions: 1)  Please schedule a follow-up appointment in 6 months. 2)  Labs next week for wellness v70.0 and Vit B12 266.20 and Vit D 268.9   Orders Added: 1)  EKG w/ Interpretation [93000] 2)  Est. Patient 40-64 years [99396] 3)  Flu Vaccine 30yrs + [90658] 4)  Admin 1st Vaccine [90471]   Immunizations  Administered:  Influenza Vaccine # 1:    Vaccine Type: Fluvax 3+    Site: left deltoid    Mfr: Sanofi Pasteur    Dose: 0.5 ml    Route: IM    Given by: Lanier Prude, CMA(AAMA)    Exp. Date: 10/03/2010    Lot #: [id number]    VIS given: 10/28/09 version given April 03, 2010.   Immunizations Administered:  Influenza Vaccine # 1:    Vaccine Type: Fluvax 3+    Site: left deltoid    Mfr: Sanofi Pasteur    Dose: 0.5 ml    Route: IM    Given by: Lanier Prude, CMA(AAMA)    Exp. Date: 10/03/2010    Lot #: [id number]    VIS given: 10/28/09 version given April 03, 2010.

## 2010-05-21 NOTE — Letter (Signed)
Summary: Medical exam forms for Lobbyist  Medical exam forms for [employer]line [employer] Determination   Imported By: Sherian Rein 05/13/2010 12:32:16  _____________________________________________________________________  External Attachment:    Type:   Image     Comment:   External Document

## 2010-07-11 LAB — POCT CARDIAC MARKERS
Myoglobin, poc: 52.5 ng/mL (ref 12–200)
Troponin i, poc: <0.05 ng/mL (ref 0.00–0.09)

## 2010-07-11 LAB — COMPREHENSIVE METABOLIC PANEL
ALT: 35 U/L (ref 0–53)
AST: 29 U/L (ref 0–37)
Albumin: 3.7 g/dL (ref 3.5–5.2)
Alkaline Phosphatase: 65 U/L (ref 39–117)
BUN: 9 mg/dL (ref 6–23)
CO2: 30 mEq/L (ref 19–32)
Calcium: 8.8 mg/dL (ref 8.4–10.5)
Chloride: 101 mEq/L (ref 96–112)
Creatinine, Ser: 0.86 mg/dL (ref 0.4–1.5)
GFR calc Af Amer: >60 mL/min (ref >60)
Glucose, Bld: 106 mg/dL — ABNORMAL HIGH (ref 70–99)
Potassium: 4.2 mEq/L (ref 3.5–5.1)

## 2010-07-11 LAB — DIFFERENTIAL
Basophils Absolute: 0.3 10*3/uL — ABNORMAL HIGH (ref 0.0–0.1)
Monocytes Absolute: 0.7 10*3/uL (ref 0.1–1.0)

## 2010-07-11 LAB — CBC
HCT: 46.2 % (ref 39.0–52.0)
MCV: 99.4 fL (ref 78.0–100.0)
RBC: 4.65 MIL/uL (ref 4.22–5.81)
WBC: 5.8 10*3/uL (ref 4.0–10.5)

## 2010-07-21 ENCOUNTER — Telehealth: Payer: Self-pay | Admitting: Internal Medicine

## 2010-07-21 ENCOUNTER — Inpatient Hospital Stay (INDEPENDENT_AMBULATORY_CARE_PROVIDER_SITE_OTHER)
Admission: RE | Admit: 2010-07-21 | Discharge: 2010-07-21 | Disposition: A | Discharge status: Home / Self Care | Payer: BC Managed Care – PPO | Source: Ambulatory Visit | Attending: Family Medicine | Admitting: Family Medicine

## 2010-07-21 DIAGNOSIS — K209 Esophagitis, unspecified without bleeding: Secondary | ICD-10-CM

## 2010-07-21 DIAGNOSIS — K219 Gastro-esophageal reflux disease without esophagitis: Secondary | ICD-10-CM

## 2010-07-21 NOTE — Telephone Encounter (Signed)
Pls  See another avail MD tomorrow. I can't see more pt w/a new med record system. Sorry. Thx

## 2010-07-21 NOTE — Telephone Encounter (Signed)
Wife called, to advise Dr Howard Gray that he has been experiencing heartburn,chest pain, anxiety for a couple weeks. It is now happening daily, with longer duration of the chest pain,and anxiety. Pt wants appt this week w/Dr Plotnikov,offered next available 4/23 and offered appt tomorrow with another MD. Wife states he only will see Dr Howard Gray and that he cannot wait till 4/23.

## 2010-07-22 NOTE — Telephone Encounter (Signed)
Per Dr. Posey Rea ok to work in thur at [employer]

## 2010-07-23 NOTE — Telephone Encounter (Signed)
Pt is requesting referral to Dr. Arlyce Dice for possible Barretts. He was seen by ER. All is ok except dx with gastroenteritis.

## 2010-07-24 NOTE — Telephone Encounter (Signed)
Ok thx.

## 2010-07-27 ENCOUNTER — Ambulatory Visit: Payer: BC Managed Care – PPO | Admitting: Internal Medicine

## 2010-08-18 NOTE — Discharge Summary (Signed)
NAMETREYSHON, BUCHANON               ACCOUNT NO.:  [id number]   MEDICAL RECORD NO.:  [id number]          PATIENT TYPE:  IPS   LOCATION:  0605                          FACILITY:  [facility]   PHYSICIAN:  Geoffery Lyons, M.D.      DATE OF BIRTH:  May 29, 1965   DATE OF ADMISSION:  09/19/2007  DATE OF DISCHARGE:  09/22/2007                               DISCHARGE SUMMARY   CHIEF COMPLAINT/HISTORY OF PRESENT ILLNESS:  This was the second  admission to [facility] for this 45 year old male,  voluntarily admitted.  History of alcohol abuse.  He has been drinking  a lot, drinking anywhere from 18 beers before he would go to bed or a  fifth of bourbon.  He has been drinking steadily to this extent for at  least a year.  Endorsed he was experiencing chest pain.  His wife had to  drive him to work for him to avoid getting a DUI.  Endorsed that he knew  he needed help.  His child will bring him a beer and say, Here you go,  daddy.  Feeling guilty, wanting help.  Normally drinks alone.  He most  often passes out, experiencing some shakiness in the morning.   PAST PSYCHIATRIC HISTORY:  He had been in [facility] before, receiving  treatment for anxiety.  No current outpatient treatment.   ALCOHOL AND DRUG HISTORY:  As already stated, persistent use of alcohol,  escalating amounts.  Some withdrawal in the morning, drinking through  the day.   MEDICAL HISTORY:  Hypertension, gastroesophageal reflux.   MEDICATIONS:  1. Hydrochlorothiazide 25 mg per day.  2. Aciphex 1 daily.  3. Norvasc 10 mg per day.  4. Zoloft 200 mg per day.  5. Xanax 0.5 to 0.25 daily.   PHYSICAL EXAMINATION:  Exam failed to show any acute findings.   LABORATORY WORKUP:  CBC within normal limits.  Sodium 133, SGOT 43.   MENTAL STATUS EXAM:  Exam revealed an alert, cooperative male.  Speech  was normal rate, tempo and production.  Had a right ear implant.  Appropriately dressed.  Speech was clear.  Mood was anxious.   Affect was  constricted.  Thought processes logical, coherent and relevant.  No  active delusions.  No active suicidal or homicidal ideas.  No  hallucinations.  Cognition well-preserved.   ADMITTING DIAGNOSES:  AXIS I:  Alcohol dependence.  Depressive disorder  not otherwise specified.  AXIS II:  No diagnosis.  AXIS III:  Hypertension, gastroesophageal reflux, hearing loss with  right ear implant.  AXIS IV:  Moderate.  AXIS V:  On admission 35, highest GAF in the last year 60.   COURSE IN THE HOSPITAL:  He was admitted, started in individual and  group psychotherapy.  We detoxed with Librium.  As already stated,  increased use of alcohol during the last year with withdrawal in the  morning, 18 beers to a fifth per day.  Used to be 5 to 6 beers.  Works  at [employer].  Was in [place], now in [place].  He had been treated for  panic disorder and OCD.  Had been on Luvox.  When in panic he feels  scared, feeling that he is going to die.  Fear.  Increased heart rate.  Has  called 911.  Had been on Prozac, Zoloft and Luvox.  On June 18, he  was still withdrawing, still with some tremulousness.  Not eating,  increased anxiety as he detoxed, worried about the panic attacks when he  is completely detoxed as he was using the alcohol as he claimed to  control the anxiety, but he realized that the anxiety and the panic got  worse as he was withdrawing.  Endorsed anxiety, tremulousness, nausea,  unable to eat, insomnia.  We pursued and completed the detox.  On June  19, he was in full contact with reality.  There were no active suicidal  or homicidal ideas, no hallucinations or delusions.  He felt he was  ready to be discharged.  There was a family session where some of the  concerns were discussed.  Wife was supportive of the idea of further  treatment although she would rather have him go to a residential  treatment program.   DISCHARGE DIAGNOSES:  AXIS I:  Alcohol dependence.  Depressive  disorder  not otherwise specified.  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  AXIS II:  No diagnosis.  AXIS III:  Hypertension, gastroesophageal reflux, hearing loss with  cochlear implant.  AXIS IV:  Moderate.  AXIS V:  Upon discharge 50 to 55.   Discharged on Norvasc 10 mg per day, Protonix 40 mg per day, Luvox 100  mg 2 daily, hydrochlorothiazide 25 mg per day, and trazodone 50 at  bedtime for sleep.   Follow up CD IOP [facility].      Geoffery Lyons, M.D.  Electronically Signed     IL/MEDQ  D:  10/25/2007  T:  10/25/2007  Job:  [id number]
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
## 2010-08-18 NOTE — Discharge Summary (Signed)
NAMEELISHAH, ASHMORE               ACCOUNT NO.:  [id number]   MEDICAL RECORD NO.:  [id number]          PATIENT TYPE:  OIB   LOCATION:  5713                         FACILITY:  [facility]   PHYSICIAN:  Carolan Shiver, M.D.    DATE OF BIRTH:  [birth year]   DATE OF ADMISSION:  10/05/2006  DATE OF DISCHARGE:  10/06/2006                               DISCHARGE SUMMARY   ADMISSION DIAGNOSIS:  Severe to profound bilateral sensorineural hearing  loss with low shoulder audiogram and severe high frequency sensorineural  hearing loss and poor discrimination in both ears.   POSTOPERATIVE DIAGNOSIS:  Severe to profound bilateral sensorineural  hearing loss with low shoulder audiogram and severe high frequency  sensorineural hearing loss and poor discrimination in both ears.   OPERATION:  [employer]ion[employer] Clarion high-res 90K with 1J electrode  cochlear implant, right ear.  Surgeon - Carolan Shiver, M.D.  Anesthesia  - General tracheal, Dr. Sampson Goon.  Complications - none.   DISCHARGE STATUS:  Stable.   SUMMARY OF HOSPITALIZATION:  Pantelis Elgersma is a 45 year old white  male who has severe to profound bilateral sensorineural hearing loss  with a low shoulder audiogram.  He had a 16% discrimination in his right  ear and 28% discrimination in his left ear.  He has been unable to wear  hearing aids, as they provide him no benefit.  Because of this, he was  recommended for a multichannel cochlear implant, right ear.  CT scanning  of his temporal bones preoperatively demonstrated patent cochleas both  ears.  He had a Pneumovax vaccine and caloric testing preoperatively  which demonstrated symmetric caloric responses in both vestibular  systems.   Ramonte and his wife were counseled extensively concerning the  advantages and disadvantages of multichannel cochlear implantation.  He  understood that there were no guarantees that he would receive any  hearing sensation from the cochlear implant.  He  understood that there  were low, medium and high performers and it would take 12-24 months of  brain training for him to learn how to use a cochlear implant.  He  understood that there were no guarantees.  He signed a written 3-page  informed consent outlining all of the risks and complications.  His  questions were invited and answered, and, again, the informed consent  was signed and witnessed.   On the morning of October 05, 2006, he was taken to [facility] OR room #3  and underwent routine implantation of an Advanced [employer]ion[employer] Clarion high-  res 90 K cochlear implant with a 1J electrode.  A full length insertion  was accomplished.  The cochleostomy was sealed with fascia, and an  intraoperative x-ray demonstrated proper position of the electrode array  within his cochlea.   He was recovered in the PACU and then transferred to 5700, room 13,  where he had an uncomplicated afebrile postoperative course.  On the  evening after the implant, he was awake and alert, had intact facial  function and no vertigo.  He was able to ambulate.  By the first  postoperative day, October 06, 2006, he was awake, alert vital signs were  stable.  He had intact facial function, no fever and was eating.  His  drain was removed.  There was no hematoma.  His incision was healing  well without.  Facial function was intact.  Total  JP drainage was 25  mL.   Mr. Pehl was recommended for discharge on the morning of October 06, 2006  with his wife who was instructed to return him to my office on October 13, 2006 at 1:50 p.m.   DISCHARGE MEDICATIONS:  1. Ceftin 500 mg p.o. b.i.d. x10 days with food.  2. Percocet 08/06/23, #30 tablets with one refill, 1-2 p.o. q.6h. p.r.n.      pain.  3. Phenergan suppositories 25 mg, #2, one PR q.6h. p.r.n. nausea.  4. Valium 5 mg p.o. t.i.d. p.r.n. __________, #20.  5. He is to continue on his home medications.   DIET:  He was placed on a regular diet.   DISCHARGE INSTRUCTIONS:  Quiet  indoor activity and head elevation.  He  may wash his hair on October 07, 2006, and he is to avoid nose blowing or  sneezing without his mouth open for the next 2 weeks.  He is to call [phone] for any postoperative problems related to the procedure.  He was  given both verbal and written instructions, as was his wife.   ADMISSION LABORATORY DATA:  Hemoglobin of 16.5, hematocrit of 47.4,  white blood cell count of 8600, platelet count of 299,000.  PT was 12.8,  PTT 27, INR 1.0.  Electrolytes were within normal limits with a  potassium of 3.7.  Chest x-ray showed no active disease.  EKG showed  sinus rhythm with fusion complexes.  Formal review of the intraoperative  plain film of his skull documented the proper position of the cochlear  implant right ear.   There were no pathology specimens.   At the time of hospitalization, the patient was in [facility] short-  stay, the [facility] OR room #3, the PACU and 5700, 13.           ______________________________  Carolan Shiver, M.D.     EMK/MEDQ  D:  10/06/2006  T:  10/06/2006  Job:  [id number]

## 2010-08-18 NOTE — H&P (Signed)
Howard Gray, Howard Gray               ACCOUNT NO.:  [id number]   MEDICAL RECORD NO.:  [id number]          PATIENT TYPE:  IPS   LOCATION:  0504                          FACILITY:  [facility]   PHYSICIAN:  Geoffery Lyons, M.D.      DATE OF BIRTH:  09/17/1965   DATE OF ADMISSION:  09/19/2007  DATE OF DISCHARGE:                       PSYCHIATRIC ADMISSION ASSESSMENT   PATIENT IDENTIFICATION:  [name] voluntarily admitted on September 19, 2007.   HISTORY OF PRESENT ILLNESS:  The patient presents with a history of  alcohol abuse.  He states he has been drinking a lot, drinking  anywhere from 18 beers before he would go to bed or a fifth of bourbons.  He has been drinking steadily to this extent for at least one year.  Realized after he was experiencing chest pain with his wife having to  drive him to work, she would do a blood alcohol on the patient to avoid  getting a DUI, that he knew he needed help, also reporting that his  child would bring him a beer and saying, here you go daddy.  He is  feeling very guilty, wants to get help.  Denies any depression or  suicidal thoughts.  He states he normally drinks alone.  No change in  behavior.  No legal problems.  Is able to go to work and function.  He  has been sleeping well, states he most often passes out, experiencing  some shakiness in the morning, has gained about 20-25 pounds.  Very  motivated to go to meetings and get any other help that he needs to  remain alcohol-free.   PAST PSYCHIATRIC HISTORY:  The patient has been here before for anxiety.  No current outpatient treatment.   SOCIAL HISTORY:  This is a 45 year old married male who lives with his  wife.  He has a 76-year-old son and 12 year old Museum/gallery conservator.  He has  been working for [employer] for the past 9 years.   FAMILY HISTORY:  Father with coronary artery disease.   ALCOHOL AND DRUG HABITS:  The patient smokes.  Denies any seizure  activity.  Denies blackouts, although he reports  that with his drinking  has had some decreased in memory.  Primary care Howard Gray is Dr.  Posey Gray at [place].   MEDICAL PROBLEMS:  1. Hypertension.  2. GERD.   MEDICATIONS:  1. Hydrochlorothiazide 25 mg daily.  2. Aciphex one daily.  3. Norvasc 10 mg daily.  4. Zoloft 200 mg daily.  5. Xanax at 0.5-2.5 daily.   DRUG ALLERGIES:  No known allergies.   PHYSICAL EXAMINATION:  The patient was fully assessed at [facility] ED.  His physical exam was reviewed with no significant findings.  The  patient was complaining of chest pain.  Lab work and chest x-ray was  done.  Chest x-ray with no significant findings.  The patient did  receive an aspirin in the emergency department.  Temperature is 97.8, 75  heart rate, 18 respirations, blood pressure is 140/92, 95% saturated.  He is 195 pounds, 5 feet 9 inches tall.  Sodium of 133.  CBC within  normal limits.  Alcohol level less than 5.  SGOT is mildly elevated at  43 with a reference range of 0-37.   MENTAL STATUS EXAM:  He is a middle-aged male sitting in the Day Room,  reading a book, calm and cooperative, good eye contact.  Speech is  articulate.  The patient does have a right ear implant.  He is  appropriately dressed.  Speech clear, articulate.  The patient's mood is  neutral.  The patient's affect is he is again pleasant and polite,  agreeable to treatment plan.  Thought processes are coherent, goal  directed.  Cognition intact.  Memory is good.  Judgment insight is fair.  Poor impulse control related to alcohol use.   DIAGNOSES:  AXIS I:  Alcohol dependence.  AXIS II:  Deferred.  AXIS III:  Hypertension, GERD and hearing loss.  AXIS IV:  Psychosocial problems related to chronic alcohol use.  AXIS V:  Current is 40-45.   PLANS:  Contract for safety.  Detox patient with Librium protocol.  That  was discussed with the patient.  The patient is encouraged fluids.  Work  on relapse prevention.  The patient will be in the red group.   Will  continue to assess comorbidities.  Have a family session with his wife  and will obtain follow-up rehab.  Tentative length of stay is 3-5 days.      Landry Corporal, N.P.      Geoffery Lyons, M.D.  Electronically Signed    JO/MEDQ  D:  09/20/2007  T:  09/20/2007  Job:  [id number]
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
## 2010-08-21 NOTE — H&P (Signed)
**Note Howard Gray** [name], [name] NO.:  [id number]   MEDICAL RECORD NO.:  [id number]          PATIENT TYPE:  EMS   LOCATION:  MAJO                         FACILITY:  [facility]   PHYSICIAN:  Willa Rough, M.D.     DATE OF BIRTH:  31-Oct-1965   DATE OF ADMISSION:  09/22/2005  DATE OF DISCHARGE:                                HISTORY & PHYSICAL   REASON FOR ADMISSION:  Mr. Howard Gray is a 45 year old male with no prior cardiac  history, but several cardiac risk factors, who presents to the emergency  room with complaints of recurrent chest pain, now relieved with  nitroglycerin.   The patient reports onset of symptoms approximately 2 weeks at which time he  was transferred to his current job.  The chest discomfort is not strictly  correlated to exertion and is unpredictable in nature.  He typically has  some associated diaphoresis and occasional radiation to the left shoulder,  but no dyspnea or nausea.  These have been occurring on a daily basis.   This morning, while at work, and after lifting some packages, he noted a  recurrence of the chest pain with radiation to the left shoulder.  This  lasted longer than usual and EMS was activated and he was treated with  sublingual nitroglycerin in the field.  He reported prompt relief and has  not had any recurrent symptoms.   On presentation, electrocardiogram shows no ischemic changes and initial  cardiac markers are negative.   ALLERGIES:  No known drug allergies.   HOME MEDICATIONS:  1.  Norvasc 10 daily.  2.  Hydrochlorothiazide 25 daily.  3.  Prevacid 30 daily.  4.  Fluvoxamine 200 mg daily.  5.  Librium 15 mg p.r.n.   PAST MEDICAL HISTORY:  Hypertension, hyperlipidemia, gastroesophageal reflux  disease, and anxiety disorder with history of panic attacks.   SOCIAL HISTORY:  The patient lives in [place] with his wife.  They have  three children.  He works for [employer].  He smokes approximately 5-6  cigarettes a day.   FAMILY HISTORY:  Father age 70, status post bypass surgery at age 8.  Brother age 45 with hypertension.  Mother has history of TIA's.   REVIEW OF SYSTEMS:  Denies any recent fever, productive cough, chills, or  nightsweats.  Denies any current active symptoms of heartburn.  Denies any  hematochezia, hemoptysis, or melena.  Denies any exertional dyspnea,  orthopnea, or paroxysmal nocturnal dyspnea, lower extremity edema, or  claudication.  Denies any tachypalpitations.  Denies exertional chest pain.  Of note, the patient's wife reports that the patient has been under severe  stress since being reassigned to his current job approximately 2 weeks ago  and has also been having significant difficulty with his coworkers.  Remaining systems are negative.   PHYSICAL EXAMINATION:  VITAL SIGNS:  Blood pressure 127/88, pulse 65 and  regular, respirations 22, the patient is afebrile.  SAO2 99% on 2 liters.  GENERAL:  [name] sitting upright in no acute distress.  HEENT:  Normocephalic and atraumatic.  NECK:  Palpable carotid pulses without  bruits.  LUNGS:  Clear to auscultation.  HEART:  Regular rate and rhythm (S1 and S2), no murmurs, rubs, or gallops.  ABDOMEN:  Soft, nontender with intact bowel sounds.  EXTREMITIES:  Palpable distal pulses without edema.  NEUROLOGY:  No focal deficit.   Admission chest x-ray; right upper lung nodular density; left lower lobe  atelectasis.   Admission EKG; normal sinus rhythm at 64 BPM with normal axis, nonspecific  ST changes.   LABORATORY DATA:  Normal hemoglobin/hematocrit. Sodium 135, potassium 3.1,  BUN 10, creatinine 0.9, glucose 106.  Initial cardiac markers (POC) are  negative.   IMPRESSION:  1.  Recurrent chest pain.      1.  Atypical features relieved by nitroglycerin.  2.  Multiple cardiac risk factors.      1.  Tobacco.      2.  Hypertension.      3.  History of hyperlipidemia.      4.  Family history of premature coronary  artery disease.  3.  Hypokalemia.  4.  Abnormal chest x-ray.  5.  Anxiety disorder.   PLAN:  The patient will be admitted for rule out myocardial infarction with  serial cardiac markers.  Although his symptoms are atypical, he does report  relief with nitroglycerin.  He is currently asymptomatic and initial markers  and EKG are negative.  If cardiac enzymes are negative, then plan is to  proceed with an outpatient exercise stress Myoview which has been scheduled  for tomorrow in our office at 12:30 p.m.   Initial portable chest x-ray in the emergency room was suggestive of a  nodular density in the right upper lung.  We will check a follow-up  PA/lateral chest x-ray for further evaluation.   The patient presented with hypokalemia (potassium 3.1) and was treated with  40 mEq while in the emergency room.  We will repeat a metabolic profile in  the morning.   The patient presents with known history of anxiety disorder and panic  attacks.  His wife is quite concerned that he has been under severe stress  these past 2 weeks and indicated that he is scheduled for a psychiatric  evaluation later this month.  However, she would prefer to proceed with an  inhouse evaluation while he is here and we will therefore request a formal  consultation with Dr. Jeanie Sewer.      Gene Serpe, P.A. [facility]    ______________________________  Willa Rough, M.D.    GS/MEDQ  D:  09/22/2005  T:  09/22/2005  Job:  [id number]   cc:   Georgina Quint. Plotnikov, M.D. [facility]  [address]  [place]  [place] [address]

## 2010-08-21 NOTE — Assessment & Plan Note (Signed)
[facility]                                 ON-CALL NOTE   Howard Gray, Howard Gray                        MRN:          [id number]  DATE:08/07/2006                            DOB:          1965-05-01    TIME:  9:16 a.m.   PHONE NUMBER:  [phone].   CALLER:  Hector Shade, the wife.   OBJECTIVE:  Patient has hives, also has panic disorder and panic attacks  and is getting worried.  The left side of his lip is swollen, both up  and down.  He also has hives on his right trunk, a couple scattered on  the left.  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  They are in  the process of moving and doing lots of yard work and thinks he was  exposed to [employer] on Friday afternoon.  He took Benadryl anti-itch,  applied topically, which they do not think has done anything.  He does  not think he got it on his mouth or in the area where he has hives, and  to their knowledge he does not have bites of any kind.  He did have a  bad reaction of [place] in [employer], and that has him worried, where  his arms were covered and swollen and he had to be changing dressings.  Therefore, he is concerned.   ASSESSMENT:  Hives, unknown reason.   PLAN:  1. Suggest he try Claritin, Zyrtec or Allegra today.  2. Augment with Benadryl due to the bad reaction that he had with the      hives.  3. Take Pepcid 40 mg a day.  4. Use topical cortisone cream in the area where he has the possible      [facility] exposure.   If this worsen, especially if he develops shortness of breath or  difficulty swallowing, he should go to the emergency room.   PRIMARY CARE Unnamed Zeien:  Dr. Posey Rea.   HOME OFFICE:  Hope Budds, MD  Electronically Signed    RNS/MedQ  DD: 08/07/2006  DT: 08/07/2006  Job #: [id number]

## 2010-08-21 NOTE — Discharge Summary (Signed)
Howard Gray, Howard Gray               ACCOUNT NO.:  [id number]   MEDICAL RECORD NO.:  [id number]          PATIENT TYPE:  IPS   LOCATION:  0301                          FACILITY:  [facility]   PHYSICIAN:  Anselm Jungling, MD  DATE OF BIRTH:  12/05/1965   DATE OF ADMISSION:  09/24/2005  DATE OF DISCHARGE:  09/27/2005                                 DISCHARGE SUMMARY   IDENTIFYING DATA AND REASON FOR ADMISSION:  The patient is a 45 year old  married white male admitted for alcohol detoxification and help with panic  and anxiety symptoms.  His last alcoholic drink had been 4-5 days prior to  admission.  He reported that he had had some withdrawal symptoms consisting  of sweats and shakes following his last drink, but those had actually  subsided by the time of his admission.  His primary care physician had  recently placed him on Luvox and Librium, several months prior to admission.  He had had some periods of sobriety in the past up to 2 years in length.  At  the time of admission he was not particularly depressed and denied any  suicidal ideation.  Please refer to the admission note for further details  pertaining to the symptoms, circumstances and history that led to his  hospitalization.  He was given an initial Axis I diagnosis of alcohol  dependence and anxiety disorder NOS.   MEDICAL AND LABORATORY:  The patient came to [place] with a history of  hypertension and GERD.  He was maintained on Norvasc 5 mg daily,  hydrochlorothiazide 12.5 mg daily, and Protonix 40 mg daily.  He was  medically and physically assessed by the psychiatric nurse practitioner upon  admission.  There were no significant medical issues.   HOSPITAL COURSE:  The patient was admitted to the adult inpatient  psychiatric service.  He was placed on a trial of Prozac 20 mg daily.  This  was well tolerated.  He was monitored for signs and symptoms of alcohol  withdrawal but did not demonstrate any of significance.  At one point  he was  given a trial of Seroquel at bedtime to assist with sleep and anxiety, but  this was poorly tolerated and subsequently discontinued.   On the third hospital day there was a family session involving the patient  and his wife.  Concerns addressed included referrals for medication  treatment with a qualified psychiatrist.  In addition, the patient's future  appointment with Ardelle Lesches at [facility] was discussed.  The patient talked about believing that he drinks to self medicate and  indicated his belief that if he could have the panic attacks under control  that he would no longer drink in a problematic fashion.  The patient  expressed a willingness to start individual therapy and/or  group therapy to  assist in managing his panic episodes through cognitive behavioral  approaches.  His wife was very supportive.   On the fourth hospital day the patient appear to be appropriate for  discharge.  It was clear by that time that he was not going to  experience  anything further in the way of any alcohol detoxification symptoms, and he  was absent any suicidal ideation or any other risk factors.   AFTERCARE:  The patient was to follow-up with Ardelle Lesches at [facility] on September 29, 2005.  He was discharged on regimen of  Protonix 40 mg daily, Prozac 20 mg daily, Norvasc 5 mg daily,  hydrochlorothiazide 12.5 mg daily.  The patient was instructed not to take  Prevacid or Luvox any further.   DISCHARGE DIAGNOSES:  AXIS I: Alcohol dependence, Panic disorder NOS.  AXIS II: Deferred.  AXIS III: History of hypertension, gastroesophageal reflux disease.Marland Kitchen  AXIS IV: Stressors severe.  AXIS V: Global assessment of functioning on discharge 67.           ______________________________  Anselm Jungling, MD  Electronically Signed     SPB/MEDQ  D:  10/08/2005  T:  10/08/2005  Job:  [id number]

## 2010-08-21 NOTE — Assessment & Plan Note (Signed)
[facility]                           PRIMARY CARE OFFICE NOTE   Howard Gray, Howard Gray                      MRN:          [id number]  DATE:04/28/2006                            DOB:          [birth year]    Howard Gray is seen as an add-on. He has been having episodes of chest  pain and discomfort with discomfort in his chest with radiation to his  left arm and his neck. The patient does have a history of significant  anxiety with panic attack as well as some alcohol abuse problems. The  patient also has a very strong positive family history for heart disease  with his father having had an myocardial infarction at age 5.   The patient reports that his episodes of his discomfort will come on and  are non-exertional. He will have discomfort with radiation, but no  diaphoresis, no shortness of breath.   Cardiac risk factors are hypertension, positive family history, alcohol  and substance abuse.   CURRENT MEDICATIONS:  1. Hydrochlorothiazide 25 mg daily.  2. Prevacid 30 mg daily.  3. Zoloft 200 mg daily.  4. Xanax 0.25 mg daily.  5. Norvasc 10 mg daily.  6. Luvox 200 mg daily.  7. Librium p.r.n.   REVIEW OF SYSTEMS:  Is negative for any constitutional, ENT, respiratory  problems.   PHYSICAL EXAMINATION:  Temperature was 99.9, blood pressure 139/90,  pulse 85, weight 188.  GENERAL APPEARANCE: This is a well-nourished gentleman  who is in no  acute distress.  CARDIOVASCULAR: 2+ radial pulses. No JVD or carotid bruits. He had a  quiet precordium with regular rate and rhythm without murmurs, rubs or  gallops.   A 12-lead electrocardiogram revealed a normal sinus rhythm. There was J-  point elevation in V1 and V2 with no other abnormalities noted.   IMPRESSION/PLAN:  Chest pain, suspect this is probably anxiety driven  and not true angina. However, given the patient's family history and his  risk factors, I believe a nuclear stress study is  appropriate to ensure  that he has not had any coronary artery disease. This will be scheduled  for the patient at first available opportunity. The patient knows to  call if he has increasing  problem or symptoms. He is to keep his appointment with Dr. Sonda Primes on February the 18th.     Rosalyn Gess Norins, MD  Electronically Signed    MEN/MedQ  DD: 04/28/2006  DT: 04/28/2006  Job #: [id number]

## 2010-08-21 NOTE — Discharge Summary (Signed)
Howard Gray, Howard Gray               ACCOUNT NO.:  [id number]   MEDICAL RECORD NO.:  [id number]          PATIENT TYPE:  INP   LOCATION:  3738                         FACILITY:  [facility]   PHYSICIAN:  Willa Rough, M.D.     DATE OF BIRTH:  04/24/1965   DATE OF ADMISSION:  09/22/2005  DATE OF DISCHARGE:  09/24/2005                                 DISCHARGE SUMMARY   PROCEDURES:  Adenosine Myoview.   CHIEF COMPLAINT:  Chest pain, cardiac enzymes negative for MI and adenosine  Myoview negative for ischemia.   SECONDARY DIAGNOSES:  1.  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  2.  Psychotic disorder.  3.  Major depressive disorder, recurrent, severe.  4.  Alcohol dependence with physiologic dependence.  5.  Hypertension.  6.  Hyperlipidemia.  7.  Gastroesophageal reflux disease.  8.  No known drug allergies.  9.  Family history of coronary artery disease with his father having bypass      surgery in his 30s.  10. Ongoing tobacco use.   TIME AT DISCHARGE:  36 minutes.   HOSPITAL COURSE:  Mr. Delapena is a 45 year old male with no previous cardiac  history.  He has cardiac risk factors and came to the emergency room with  recurrent chest pain which occurred with exertion and was relieved with  nitroglycerin.  He was admitted for further evaluation and treatment.   His cardiac enzymes were negative for MI.  His potassium was minimally low  at 3.4 and his HCTZ was discontinued.  His TSH was within normal limits.  He  has an abnormal lipid profile with a total cholesterol of 210, triglycerides  118, HDL 57, LDL 129.  As he does not have known coronary artery disease or  diabetes, diet management alone is indicated at this time.   It was felt that Mr. Evola could be evaluated with a Myoview, which was done  in hospital.  The Myoview showed no evidence of scar or ischemia and an  EF  of 64%.  Mr. Preslar' chest pain completely resolved.  Dr. Myrtis Ser did not feel  that any further inpatient cardiac workup was indicated and he could be  safely discharged from a cardiac standpoint.   Mr. Carillo has significant anxiety issues.  There is also ETOH as well.  He  was evaluated by Dr. Jeanie Sewer and Dr. Jeanie Sewer felt that his psychiatric  problems were significant, and it is recommended that the patient be  psychiatrically hospitalized when medically cleared.  He needs further  evaluation and treatment of anxiety and mood symptoms as well as  detoxification from alcohol, enhancement of coping skills and relapse  prevention skills.   By September 24, 2005, Dr. Myrtis Ser had cleared Mr. Pelc from a cardiac standpoint.  Behavioral Health was contacted and he is being discharged for admission to  [facility] on September 24, 2005.   DISCHARGE INSTRUCTIONS:  1.  His activity is to be as tolerated.  2.  He is to stick to a low-fat diet.  3.  He is to follow up with Dr. Myrtis Ser on a p.r.n. basis.   DISCHARGE MEDICATIONS:  1.  Norvasc 10 mg daily.  2.  Phenobarbital 30 mg q.12h. today and then phenobarbital 15 mg q.8h. on      June 23, then phenobarbital 15 mg q.12h. on June 24, as well as      phenobarbital 60 mg q.4h. p.r.n. for withdrawal symptoms.  3.  Protonix 40 mg a day.  4.  Thiamine 100 mg p.o. daily.  5.  Librium 15 mg p.o. p.r.n. anxiety.  6.  Reglan 10 mg q.6h. p.r.n. for nausea and vomiting.  7.  Fluvoxamine 200 mg daily is on hold.  8.  Hydrochlorothiazide 25 mg daily is discontinued.      Theodore Demark, P.A. [facility]    ______________________________  Willa Rough, M.D.    RB/MEDQ  D:  09/24/2005  T:  09/24/2005  Job:  [id number]   cc:   Antonietta Breach, M.D.   Georgina Quint. Plotnikov, M.D. [facility]  [address]  [place]  [place] [address]

## 2010-08-21 NOTE — Assessment & Plan Note (Signed)
[facility]                                   ON-CALL NOTE   Howard Gray, Howard Gray                        MRN:          [id number]  DATE:10/31/2005                            DOB:          [birth year]    Phone call came from his wife, [name], at [phone], at 5:00 p.m. on July 29.   Howard Gray was just in the hospital with chest pain.  He had cardiac etiology  ruled out.  He was diagnosed with panic attacks.  He has been using Xanax  when needed apparently.   His wife called because, she got back from shopping and found him panicky  again, because he has some weakness in the right side of his face, which I  guess was from this morning.  When I asked her about objective findings, she  did saw there was an asymmetry and he could not raise the right side of his  face.  The patient also noticed some difficulty swallowing.  No obvious new  neurologic signs other than that.  There is no history of a tick bite that  she knows.  She asked for advice.   PLAN:  Given the new neurologic symptoms and the chance that this is not  just a Bell's palsy, I told her that she really needs to bring him to the  emergency room.  She has already given him a second Xanax to try to get him  to calm done, as he sounds like he is pretty panicky; however, given the new  neurologic findings in this patient, I think an emergency room evaluation is  warranted and I instructed her to do that.                                   Karie Schwalbe, MD   RIL/MedQ  DD:  10/31/2005  DT:  11/01/2005  Job #:  [id number]   cc:   Sonda Primes, MD

## 2010-08-21 NOTE — Consult Note (Signed)
NAMEJESE, COMELLA               ACCOUNT NO.:  [id number]   MEDICAL RECORD NO.:  [id number]          PATIENT TYPE:  INP   LOCATION:  1826                         FACILITY:  [facility]   PHYSICIAN:  Antonietta Breach, M.D.  DATE OF BIRTH:  [birth year]   DATE OF CONSULTATION:  09/22/2005  DATE OF DISCHARGE:                                   CONSULTATION   REASON FOR CONSULTATION:  Severe anxiety and mood disturbance.   HISTORY OF PRESENT ILLNESS:  Mr. Sansoucie is a 45 year old married male  admitted to the [facility] for atypical chest pain onset.  While at work and lifting a box, he experienced severe chest pain and is now  in the emergency room for evaluation of that.   Mr. Enck has had a worsening of his obsessions and his tendency towards  compulsions over several weeks.  His compulsions include:  Counting and  checking rituals and cleaning rituals.   The precipitating acute stress involves a transfer of place of employment.  At the new place of employment, he has the stress of being the new worker on  the job.  He has the same abilities and technical skills as the other  coworkers; however, they have been there longer and he senses that they are  no respecting him and giving him a hard time.  He has had intermittent  thoughts of killing these coworkers, one in particular with fantasies of  chopping his head off.  The patient is distressed about these thoughts.  The  patient denies any thoughts of harming himself.  The patient has had an  increase in his visual hallucinations.  They are not just hypnopompic or  hypnagogic, they occur with his eyes wide open and they involve vivid,  defined images, just as women's faces and are not just amorphus blobs.  The  patient describes progressive worsening insomnia, depressed mood, decreased  concentration and anhedonia as well as thoughts of hopelessness and  helplessness over a number of weeks.  He also describes drinking at least  a  12 pack of beer per day for several months to years and experiencing sweats  and tremors on days that he has a reduction of intake.  He states that this  drinking has even increased lately and he is trying to self-medicate his  worsening anxiety with the alcohol.   The patient also has a history of panic attacks which are also recurring.   PAST PSYCHIATRIC HISTORY:  The patient has no history of mania, no history  of suicide attempts and no history of psychiatric hospitalization.  He first  began having panic attacks over a decade ago.  They have occurred as  approximately 15 minute bursts of fear, doom and dread thoughts, any motions  along with palpitations, sweating, shortness of breath.  After the 15-minute  surge of symptoms, he is left with exhaustion and ongoing fear of the next  attack.  He has many more than 4 in a month in the past.   His hallucinatory experience goes back to his 52s and may be in a post  psychomimetic context.  This is not yet determined.  These do involve visual  hallucinations only.  They were infrequent early in his life.  They do  involve defined images and not just amorphus blobs.  He always has insight  into them.  He does not know of any auditory hallucinations and they have  not been associated with a deterioration in work or social function.  Please  see the social history below.  Regarding major depression, he has had a  number of major depressive episodes in the past.  Concerning his obsessive  compulsive history, his checking and cleaning compulsions progressed to the  point that they were highly distressful and interfering with his social and  occupational functioning.  He has never had cognitive behavioral therapy or  any other course of psychotherapy.  He has been started on a medication by a  psychiatrist in the past with Luvox increased to 200 mg p.o. daily.  He  estimates about a 40-50% reduction in his OC symptoms with that.  He has   also had various benzodiazepine therapies.  Using benzodiazepines  concomitantly without alcohol has been his recent pattern.  He has been  taking Librium 15 mg at an unknown rate.   FAMILY PSYCHIATRIC HISTORY:  None known.   SOCIAL HISTORY:  Mr. Spinnato lives in [place] with a supportive wife.  They  have 3 children.  He works at [employer].  He has no current illegal drug  use.  His illegal drug use is remote and occurred in his early 71s.  He does  smoke 5-6 cigarettes per day.  See the history above for other substance  use.  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  He was with the [employer]	[place] as an [place], never received any judicial processing.  He  never received any articles __________  and as eluded to above, there was no  symptoms psychiatrically that impaired his function.   GENERAL MEDICAL HISTORY:  Atypical chest pain with current cardiac workup.   LABORATORY DATA:  CBC is unremarkable.  The INR is unremarkable.  Chemistry  panel is unremarkable.  SGOT is 26, SGPT 23.   MEDICATIONS:  MAR is reviewed.  Psychotropics current include:  Librium 15  mg p.r.n.   ALLERGIES:  The patient has no known drug allergies.   REVIEW OF SYSTEMS:  CONSTITUTIONAL:  No head trauma.  EYES:  No visual  changes other than above.  ENT:  No sore throat.  RESPIRATORY:  No cough.  GASTROINTESTINAL:  No nausea, vomiting, diarrhea.  History of  gastroesophageal reflux disease.  GENITOURINARY:  No dysuria.  ENDOCRINE:  Metabolic unremarkable.  CARDIOVASCULAR:  The patient has a history of  hypertension.  NEUROLOGIC:  Unremarkable.  PSYCHIATRIC:  As above.  SKIN:  Unremarkable.  HEMATOLOGIC/LYMPHATIC:  Unremarkable.  MUSCULOSKELETAL:  No  deformities, weakness or atrophy.   EXAMINATION:  VITAL SIGNS:  Afebrile and vital signs stable.  NEUROLOGIC:  On mental status exam, Mr. Coakley is a middle-aged male sitting up in his emergency room bed with an anxious affect, good eye contact.   His  speech is slightly anxious and pressured.  He is oriented to the year, the  month, the day of the month, the day of the week, time, place and person.  Thought process is logical, coherent, goal-directed without looseness of  association.  Thought content:  The patient has no current homicidal  ideation and as mentioned he is distressed that he has  been having those  thoughts as described above.  He has no suicidal thoughts.  No current  hallucinations, no delusions.  The patient is concerned though that if he  does not get any help for the symptoms described above, that he could become  destructive.  Judgment is intact for the capacity to consent to medical and  psychiatric treatment.  His insight is fair to good.  Concentration is  mildly decreased.  Fund of knowledge and intelligence is average to above  average.  Memory is intact for immediate, recent and remote.   ASSESSMENT:  Axis I:  Anxiety disorder, not otherwise specified (with a  functional history of panic and obsessive compulsive disorder, also with an  organic influence of alcohol withdrawal).  Psychotic disorder, not otherwise  specified.  Major depressive disorder, recurrent, severe.  Alcohol  dependence with physiologic dependence.  Axis II:  Deferred.  Axis III:  See general medical problems above.  Axis IV:  Occupational.  Axis V:  40.   RECOMMENDATIONS:  1.  It is recommended that the patient be psychiatrically hospitalized when      medically cleared for a dual diagnosis admission involving further      evaluation and treatment of anxiety and mood symptoms as well as      detoxification from alcohol and including enhancement of coping skills      and relapse prevention skills.  2.  Would proceed with the phenobarbital withdrawal protocol which will      include thiamin, initially given intramuscularly, as well as folate and      multivitamin for nutritional replacement.  3.  Would provide Luvox at a dosage of  100 mg p.o. daily currently to      prevent __________  withdrawal, but also allow the possibility of a      change in the __________  agent.  4.  We would also order a urine drug screen as well as a TSH.  5.  The patient is motivated and volunteering for psychiatric      hospitalization, possible psychiatric wards include the [facility] as well as [facility] or [facility] or [place], depending upon availability as well as managed care      panel membership.  The case manager can facilitate the process of      determining bed availability.  6.  Of note, a key component that has missing to the patient's outpatient      therapy involves cognitive behavioral therapy.  Therefore, it is      recommended that if it is not determined that CBT would be      contraindicated, that the patient should have a course of cognitive     behavioral therapy combined with his biologic therapy once he is      discharged.      Antonietta Breach, M.D.  Electronically Signed     JW/MEDQ  D:  09/22/2005  T:  09/22/2005  Job:  [id number]

## 2010-08-27 ENCOUNTER — Telehealth: Payer: Self-pay | Admitting: Gastroenterology

## 2010-08-27 NOTE — Telephone Encounter (Signed)
Pt is having increased pain and burning. Wife states he cannot wait for 5/31 appt with Dr. Arlyce Dice. Pt given an appt to see Willette Cluster NP for 08/28/10@2pm . Pt aware of appt date and time.

## 2010-08-27 NOTE — Telephone Encounter (Signed)
Left message to call back  

## 2010-08-28 ENCOUNTER — Other Ambulatory Visit (INDEPENDENT_AMBULATORY_CARE_PROVIDER_SITE_OTHER): Payer: BC Managed Care – PPO

## 2010-08-28 ENCOUNTER — Ambulatory Visit (INDEPENDENT_AMBULATORY_CARE_PROVIDER_SITE_OTHER): Payer: BC Managed Care – PPO | Admitting: Nurse Practitioner

## 2010-08-28 ENCOUNTER — Encounter: Payer: Self-pay | Admitting: Nurse Practitioner

## 2010-08-28 VITALS — BP 118/72 | HR 80 | Ht 68.0 in | Wt 194.0 lb

## 2010-08-28 DIAGNOSIS — F1020 Alcohol dependence, uncomplicated: Secondary | ICD-10-CM

## 2010-08-28 DIAGNOSIS — R1084 Generalized abdominal pain: Secondary | ICD-10-CM

## 2010-08-28 DIAGNOSIS — K219 Gastro-esophageal reflux disease without esophagitis: Secondary | ICD-10-CM

## 2010-08-28 LAB — CBC WITH DIFFERENTIAL/PLATELET
Basophils Relative: 0.9 % (ref 0.0–3.0)
Eosinophils Absolute: 0.4 10*3/uL (ref 0.0–0.7)
Eosinophils Relative: 3.6 % (ref 0.0–5.0)
Lymphs Abs: 3.6 10*3/uL (ref 0.7–4.0)
Monocytes Relative: 8.5 % (ref 3.0–12.0)
Neutro Abs: 6.5 10*3/uL (ref 1.4–7.7)
Platelets: 305 10*3/uL (ref 150.0–400.0)
RBC: 4.48 Mil/uL (ref 4.22–5.81)
RDW: 12.7 % (ref 11.5–14.6)
WBC: 11.6 10*3/uL — ABNORMAL HIGH (ref 4.5–10.5)

## 2010-08-28 LAB — COMPREHENSIVE METABOLIC PANEL
ALT: 28 U/L (ref 0–53)
AST: 25 U/L (ref 0–37)
Albumin: 3.9 g/dL (ref 3.5–5.2)
BUN: 13 mg/dL (ref 6–23)
Chloride: 97 mEq/L (ref 96–112)
Creatinine, Ser: 1.2 mg/dL (ref 0.4–1.5)
GFR: 68.78 mL/min (ref >60.00)
Glucose, Bld: 110 mg/dL — ABNORMAL HIGH (ref 70–99)
Sodium: 134 mEq/L — ABNORMAL LOW (ref 135–145)
Total Protein: 7.4 g/dL (ref 6.0–8.3)

## 2010-08-28 LAB — AMYLASE: Amylase: 56 U/L (ref 27–131)

## 2010-08-28 MED ORDER — TRAMADOL HCL 50 MG PO TABS: 50.0000 mg | ORAL_TABLET | Freq: Four times a day (QID) | ORAL | Status: DC | PRN

## 2010-08-28 NOTE — Progress Notes (Signed)
08/28/2010 Howard Gray [id number] 1965-09-12   HISTORY OF PRESENT ILLNESS:  Patient is a 45 year old male worked in for evaluation of chest pain. Reviewing previous records it appears patient has chronic intermittent chest pain. Chest xrays in the past have been unremarkable. A Myoview 4-5 years ago was unremarkable. He was evaluated in the ER slightly over a month ago for chest pain / epigastric pain. Patient has a longstanding history of GERD, on PPI since late 1990's. If he forgets a dose will get heartburn and regurgitation. Usually takes his PPI after breakfast.   Patient describes pain as being more in upper abdomen (at least for the last four to five weeks). No radiation of pain to back. Pain constant to certain degree but increases in intensity for unclear reasons. The pain has been as high as 7/10. No weight loss. Having intermittent nausea, worse in mornings. Bowel movements are normal. Occassional hemorrhoidal bleeding.   Past Medical History  Diagnosis Date  . Anxiety   . GERD (gastroesophageal reflux disease)   . Alcoholism   . Hypertension   . Vitamin B12 deficiency   . Asthma   . Cluster headaches    Past Surgical History  Procedure Date  . Cochlear implant     reports that he has been smoking Cigarettes.  He has been smoking about .3 packs per day. He has never used smokeless tobacco. He reports that he does not drink alcohol or use illicit drugs. family history includes Heart disease in his father; Hypertension in an unspecified family member; and Stroke in his mother.  There is no history of Colon cancer. Allergies  Allergen Reactions  . Esomeprazole Magnesium     REACTION: not working  . Fluoxetine Hcl     REACTION: vertigo  . Omeprazole     REACTION: not working      Outpatient Encounter Prescriptions as of 08/28/2010  Medication Sig Dispense Refill  . ALPRAZolam (XANAX) 0.5 MG tablet Take 0.5 mg by mouth daily as needed.       Marland Kitchen amLODipine-olmesartan  (AZOR) 10-40 MG per tablet Take 1 tablet by mouth daily.        . busPIRone (BUSPAR) 15 MG tablet Take 15 mg by mouth 2 (two) times daily.        . Cholecalciferol (VITAMIN D3) 1000 UNITS CAPS Take 2 tablets by mouth daily.       . fluvoxaMINE (LUVOX) 100 MG tablet Take 200 mg by mouth at bedtime.       . lansoprazole (PREVACID) 30 MG capsule Take 30 mg by mouth daily.        . naproxen (NAPROSYN) 500 MG tablet Take 500 mg by mouth 2 (two) times daily with a meal.        . vitamin B-12 (CYANOCOBALAMIN) 500 MCG tablet Take 500 mcg by mouth daily.        Marland Kitchen DISCONTD: pantoprazole (PROTONIX) 40 MG tablet Take 40 mg by mouth daily.        Marland Kitchen DISCONTD: thiamine 100 MG tablet Take 100 mg by mouth daily.           REVIEW OF SYSTEMS  : All other systems reviewed and negative except where noted in the History of Present Illness.   PHYSICAL EXAM: General: Well developed white male in no acute distress Head: Normocephalic and atraumatic Eyes:  sclerae anicteric,conjunctive pink. Ears: Hard of hearing. Mouth: No deformity or lesions Neck: Supple, no masses.  Lungs: Clear throughout to auscultation  Heart: Regular rate and rhythm; no murmurs heard Abdomen: Soft, non distended, nontender. No masses or hepatomegaly noted. Normal Bowel sounds Rectal: Not done Musculoskeletal: Symmetrical with no gross deformities  Skin: No lesions on visible extremities Extremities: No edema or deformities noted Neurological: Alert oriented x 4, grossly nonfocal Cervical Nodes:  No significant cervical adenopathy Psychological:  Alert and cooperative. Normal mood and affect  ASSESSMENT AND PLAN;

## 2010-08-28 NOTE — Patient Instructions (Addendum)
CT Scan scheduled for 5/30 2:00 pm arrive at 1:45 pm   At [facility] [address] Drink first  bottle of your contrast at 12 noon  and the second bottle at 1:00 pm (you cannot put over ice but can put in Fridge chill.) Nothing to eat or drink 4 hour prior. Increase your Prevacid to twice daily  Tramadol has been sent to your pharmacy for pain control. Please go to basement floor and have your labs done today.    Our office will contact you once the results are available. Follow-up with Dr. Christella Hartigan in 2-3 weeks.

## 2010-08-31 ENCOUNTER — Encounter: Payer: Self-pay | Admitting: Nurse Practitioner

## 2010-08-31 DIAGNOSIS — R1084 Generalized abdominal pain: Secondary | ICD-10-CM | POA: Insufficient documentation

## 2010-08-31 NOTE — Assessment & Plan Note (Signed)
Generalized upper abdominal pain, initially began in LUQ, now spanning across whole upper abdomen. He has associated nausea. Rule PUD. Rule out pancreatitis (drinks at least 10 beers a day).  Biliary disease possible but less likely. Will obtain basic labs, schedule CTscan. If above tests negative and symptoms persist will likely need EGD

## 2010-08-31 NOTE — Assessment & Plan Note (Addendum)
Longstanding GERD with pyrosis and regurgitation. Asymptomatic on PPI. At some point needs Barrett's screening but he may need Propofol given history of significant anxiety.

## 2010-09-01 NOTE — Progress Notes (Signed)
i suspect his alcholism plays a role.  Agree with the plan.  Would definitely want this done with propofol.

## 2010-09-01 NOTE — Assessment & Plan Note (Signed)
Normal LFTs at last check (Jan. 2012)

## 2010-09-02 ENCOUNTER — Ambulatory Visit (INDEPENDENT_AMBULATORY_CARE_PROVIDER_SITE_OTHER)
Admission: RE | Admit: 2010-09-02 | Discharge: 2010-09-02 | Disposition: A | Discharge status: Home / Self Care | Payer: BC Managed Care – PPO | Source: Ambulatory Visit | Attending: Gastroenterology | Admitting: Gastroenterology

## 2010-09-02 ENCOUNTER — Telehealth: Payer: Self-pay | Admitting: Gastroenterology

## 2010-09-02 DIAGNOSIS — R1084 Generalized abdominal pain: Secondary | ICD-10-CM

## 2010-09-02 MED ORDER — IOHEXOL 300 MG/ML  SOLN
100.0000 mL | Freq: Once | INTRAMUSCULAR | Status: AC | PRN
  Administered 2010-09-02: 80 mL via INTRAVENOUS

## 2010-09-02 NOTE — Telephone Encounter (Signed)
Spoke with pt who stated he will come to the appt tomorrow. Informed him and his wife to be there at 11am and he is to be NPO after midnight. Informed pt he CAN take his XANAX with  Sip of water early!

## 2010-09-02 NOTE — Telephone Encounter (Signed)
Spoke with wife who stated pt is still having constant right and left upper quadrant pain. Pt is taking Ultram. Per Dr Christella Hartigan, ok to schedule pt for EGD with Propofol at [facility] tomorrow at 12:45. Booking # [id number] with Clydie Braun.  Phoned pt's wife who stated she is going to get pt in [place] and I will call her before 5pm to see if pt is willing to proceed. Wife's cell [phone]

## 2010-09-03 ENCOUNTER — Other Ambulatory Visit: Payer: Self-pay | Admitting: Gastroenterology

## 2010-09-03 ENCOUNTER — Encounter: Payer: BC Managed Care – PPO | Admitting: Gastroenterology

## 2010-09-03 ENCOUNTER — Ambulatory Visit (HOSPITAL_COMMUNITY)
Admission: RE | Admit: 2010-09-03 | Discharge: 2010-09-03 | Disposition: A | Discharge status: Home / Self Care | Payer: BC Managed Care – PPO | Source: Ambulatory Visit | Attending: Gastroenterology | Admitting: Gastroenterology

## 2010-09-03 ENCOUNTER — Ambulatory Visit: Payer: BC Managed Care – PPO | Admitting: Gastroenterology

## 2010-09-03 ENCOUNTER — Telehealth: Payer: Self-pay | Admitting: Gastroenterology

## 2010-09-03 DIAGNOSIS — K2970 Gastritis, unspecified, without bleeding: Secondary | ICD-10-CM

## 2010-09-03 DIAGNOSIS — R933 Abnormal findings on diagnostic imaging of other parts of digestive tract: Secondary | ICD-10-CM

## 2010-09-03 DIAGNOSIS — K228 Other specified diseases of esophagus: Secondary | ICD-10-CM | POA: Insufficient documentation

## 2010-09-03 DIAGNOSIS — K2289 Other specified disease of esophagus: Secondary | ICD-10-CM | POA: Insufficient documentation

## 2010-09-03 DIAGNOSIS — K219 Gastro-esophageal reflux disease without esophagitis: Secondary | ICD-10-CM | POA: Insufficient documentation

## 2010-09-03 DIAGNOSIS — K2990 Gastroduodenitis, unspecified, without bleeding: Secondary | ICD-10-CM | POA: Insufficient documentation

## 2010-09-03 DIAGNOSIS — F1010 Alcohol abuse, uncomplicated: Secondary | ICD-10-CM | POA: Insufficient documentation

## 2010-09-03 DIAGNOSIS — Z79899 Other long term (current) drug therapy: Secondary | ICD-10-CM | POA: Insufficient documentation

## 2010-09-03 DIAGNOSIS — R109 Unspecified abdominal pain: Secondary | ICD-10-CM

## 2010-09-03 DIAGNOSIS — I10 Essential (primary) hypertension: Secondary | ICD-10-CM | POA: Insufficient documentation

## 2010-09-03 MED ORDER — LANSOPRAZOLE 30 MG PO CPDR: 30.0000 mg | DELAYED_RELEASE_CAPSULE | Freq: Two times a day (BID) | ORAL | Status: DC

## 2010-09-03 NOTE — Telephone Encounter (Signed)
Double ppi

## 2010-09-16 ENCOUNTER — Other Ambulatory Visit: Payer: Self-pay | Admitting: Internal Medicine

## 2010-09-25 ENCOUNTER — Ambulatory Visit: Payer: Self-pay | Admitting: Internal Medicine

## 2010-10-02 ENCOUNTER — Other Ambulatory Visit: Payer: Self-pay | Admitting: Internal Medicine

## 2010-10-03 ENCOUNTER — Emergency Department (HOSPITAL_COMMUNITY)
Admission: EM | Admit: 2010-10-03 | Discharge: 2010-10-03 | Disposition: A | Discharge status: Home / Self Care | Payer: BC Managed Care – PPO | Attending: Emergency Medicine | Admitting: Emergency Medicine

## 2010-10-03 ENCOUNTER — Emergency Department (HOSPITAL_COMMUNITY): Payer: BC Managed Care – PPO

## 2010-10-03 DIAGNOSIS — F341 Dysthymic disorder: Secondary | ICD-10-CM | POA: Insufficient documentation

## 2010-10-03 DIAGNOSIS — R5381 Other malaise: Secondary | ICD-10-CM | POA: Insufficient documentation

## 2010-10-03 DIAGNOSIS — I10 Essential (primary) hypertension: Secondary | ICD-10-CM | POA: Insufficient documentation

## 2010-10-03 DIAGNOSIS — R55 Syncope and collapse: Secondary | ICD-10-CM | POA: Insufficient documentation

## 2010-10-03 DIAGNOSIS — K219 Gastro-esophageal reflux disease without esophagitis: Secondary | ICD-10-CM | POA: Insufficient documentation

## 2010-10-03 DIAGNOSIS — R42 Dizziness and giddiness: Secondary | ICD-10-CM | POA: Insufficient documentation

## 2010-10-03 DIAGNOSIS — Z79899 Other long term (current) drug therapy: Secondary | ICD-10-CM | POA: Insufficient documentation

## 2010-10-03 LAB — DIFFERENTIAL
Basophils Absolute: 0.1 10*3/uL (ref 0.0–0.1)
Eosinophils Relative: 3 % (ref 0–5)
Lymphocytes Relative: 26 % (ref 12–46)
Lymphs Abs: 2 10*3/uL (ref 0.7–4.0)
Monocytes Absolute: 1.3 10*3/uL — ABNORMAL HIGH (ref 0.1–1.0)
Neutro Abs: 4.2 10*3/uL (ref 1.7–7.7)

## 2010-10-03 LAB — CBC
HCT: 39.7 % (ref 39.0–52.0)
MCV: 91.1 fL (ref 78.0–100.0)
Platelets: 238 10*3/uL (ref 150–400)
RBC: 4.36 MIL/uL (ref 4.22–5.81)
WBC: 7.9 10*3/uL (ref 4.0–10.5)

## 2010-10-03 LAB — CK TOTAL AND CKMB (NOT AT ARMC)
CK, MB: 1.6 ng/mL (ref 0.3–4.0)
Relative Index: INVALID (ref 0.0–2.5)
Total CK: 44 U/L (ref 7–232)

## 2010-10-03 LAB — BASIC METABOLIC PANEL
BUN: 11 mg/dL (ref 6–23)
Calcium: 8.8 mg/dL (ref 8.4–10.5)
GFR calc Af Amer: >60 mL/min (ref >60)
GFR calc non Af Amer: >60 mL/min (ref >60)
Potassium: 3.8 mEq/L (ref 3.5–5.1)

## 2010-10-03 LAB — URINALYSIS, ROUTINE W REFLEX MICROSCOPIC
Bilirubin Urine: NEGATIVE
Hgb urine dipstick: NEGATIVE
Nitrite: NEGATIVE
Specific Gravity, Urine: 1.014 (ref 1.005–1.030)
pH: 6 (ref 5.0–8.0)

## 2010-10-13 ENCOUNTER — Encounter: Payer: Self-pay | Admitting: Internal Medicine

## 2010-10-13 ENCOUNTER — Ambulatory Visit (INDEPENDENT_AMBULATORY_CARE_PROVIDER_SITE_OTHER): Payer: BC Managed Care – PPO | Admitting: Gastroenterology

## 2010-10-13 VITALS — BP 136/84 | HR 90 | Ht 69.0 in | Wt 194.0 lb

## 2010-10-13 DIAGNOSIS — K2970 Gastritis, unspecified, without bleeding: Secondary | ICD-10-CM

## 2010-10-13 DIAGNOSIS — F1020 Alcohol dependence, uncomplicated: Secondary | ICD-10-CM

## 2010-10-13 DIAGNOSIS — K2990 Gastroduodenitis, unspecified, without bleeding: Secondary | ICD-10-CM

## 2010-10-13 NOTE — Progress Notes (Signed)
Review of pertinent gastrointestinal problems: 1. Mild to mod, h pylori neg gastritis on EGD 08/2010; likely from alcohol (10 beers a day). 2. Short segment Barret's without dysplasia, EGD 08/2100, recall at one year interval.  HPI: This is a pleasant 45 year old man who is here with his wife today. He tells me he has been having no troubles. He says he has cut back on his alcohol consumption down to about 8 beers a day instead of 10. His wife mentioned twice but he has only 6 tramadol left and needs another prescription. She takes tramadol for her chronic migraines. He said he only takes tramadol once a week.    Past Medical History:   Anxiety                                                      GERD (gastroesophageal reflux disease)                       Alcoholism                                                   Hypertension                                                 Vitamin B12 deficiency                                       Asthma                                                       Cluster headaches                                            Barrett's esophagus                                         Past Surgical History:   COCHLEAR IMPLANT                                             reports that he has been smoking Cigarettes.  He has been smoking about .3 packs per day. He has never used smokeless tobacco. He reports that he drinks alcohol. He reports that he does not use illicit drugs.  family history includes Heart disease in his father; Hypertension in an unspecified family member; and Stroke in his mother.  There is no history of Colon cancer.  Current medicines and allergies were reviewed in [facility]    Physical Exam: BP 136/84  Pulse 90  Ht 5\' 9"  (1.753 m)  Wt 194 lb (87.998 kg)  BMI 28.65 kg/m2 Constitutional: generally well-appearing Psychiatric: alert and oriented x3 Abdomen: soft, nontender, nondistended, no obvious ascites, no peritoneal  signs, normal bowel sounds     Assessment and plan: 45 y.o. male with alcoholism, chronic abdominal pain  I explained to him that the very high alcohol intake is almost certainly causing his abdominal pain isn't the best thing he can do is cut back or stop altogether. He has tried AA in the past and it worked for a short time and then marital problems arose and he started drinking again. He never asked for pain medicines but his wife did twice. I was not comfortable prescribing more tramadol, especially since he said he rarely uses them.  He has Barrett's changes in his distal esophagus, very short segment. Repeat endoscopy will be in one year.

## 2010-10-13 NOTE — Patient Instructions (Signed)
We will set up referral to behavioral therapist [facility]) for help with alcohol addiction. Cutting back or stopping alcohol is the best way to help your abd pains and overall health.

## 2010-11-13 ENCOUNTER — Other Ambulatory Visit: Payer: Self-pay | Admitting: Internal Medicine

## 2010-12-31 LAB — URINE DRUGS OF ABUSE SCREEN W ALC, ROUTINE (REF LAB)
Amphetamine Screen, Ur: NEGATIVE
Amphetamine Screen, Ur: NEGATIVE
Amphetamine Screen, Ur: NEGATIVE
Cocaine Metabolites: NEGATIVE
Creatinine,U: 117.3
Ethyl Alcohol: <10
Ethyl Alcohol: <10
Ethyl Alcohol: <10
Marijuana Metabolite: NEGATIVE
Marijuana Metabolite: NEGATIVE
Methadone: NEGATIVE
Methadone: NEGATIVE
Opiate Screen, Urine: NEGATIVE
Opiate Screen, Urine: NEGATIVE
Opiate Screen, Urine: NEGATIVE
Phencyclidine (PCP): NEGATIVE
Phencyclidine (PCP): NEGATIVE
Propoxyphene: NEGATIVE
Propoxyphene: NEGATIVE
Propoxyphene: NEGATIVE

## 2010-12-31 LAB — COMPREHENSIVE METABOLIC PANEL
ALT: 46
AST: 43 — ABNORMAL HIGH
Albumin: 3.8
Calcium: 9
Creatinine, Ser: 1.26
GFR calc Af Amer: >60
Sodium: 133 — ABNORMAL LOW
Total Protein: 7.7

## 2010-12-31 LAB — BENZODIAZEPINE, QUANTITATIVE, URINE
Alprazolam (GC/LC/MS), ur confirm: NEGATIVE
Flurazepam GC/MS Conf: NEGATIVE
Nordiazepam GC/MS Conf: NEGATIVE
Nordiazepam GC/MS Conf: NEGATIVE
Nordiazepam GC/MS Conf: NEGATIVE
Oxazepam GC/MS Conf: 560 ng/mL
Oxazepam GC/MS Conf: 300 ng/mL
Oxazepam GC/MS Conf: 380 ng/mL

## 2010-12-31 LAB — CBC
MCHC: 34.9
Platelets: 289
RBC: 4.74
RDW: 13.1

## 2010-12-31 LAB — DIFFERENTIAL
Eosinophils Absolute: 0.3
Eosinophils Relative: 4
Lymphocytes Relative: 32
Lymphs Abs: 3.2
Monocytes Relative: 12

## 2010-12-31 LAB — D-DIMER, QUANTITATIVE: D-Dimer, Quant: 0.24

## 2010-12-31 LAB — POCT CARDIAC MARKERS: Troponin i, poc: <0.05

## 2011-01-01 ENCOUNTER — Ambulatory Visit (INDEPENDENT_AMBULATORY_CARE_PROVIDER_SITE_OTHER): Payer: BC Managed Care – PPO | Admitting: Internal Medicine

## 2011-01-01 ENCOUNTER — Encounter: Payer: Self-pay | Admitting: Internal Medicine

## 2011-01-01 VITALS — BP 134/80 | HR 84 | Temp 98.7°F | Resp 16 | Wt 193.0 lb

## 2011-01-01 DIAGNOSIS — I10 Essential (primary) hypertension: Secondary | ICD-10-CM

## 2011-01-01 DIAGNOSIS — J40 Bronchitis, not specified as acute or chronic: Secondary | ICD-10-CM

## 2011-01-01 DIAGNOSIS — E538 Deficiency of other specified B group vitamins: Secondary | ICD-10-CM

## 2011-01-01 DIAGNOSIS — M25519 Pain in unspecified shoulder: Secondary | ICD-10-CM

## 2011-01-01 LAB — URINE DRUGS OF ABUSE SCREEN W ALC, ROUTINE (REF LAB)
Amphetamine Screen, Ur: NEGATIVE
Amphetamine Screen, Ur: NEGATIVE
Amphetamine Screen, Ur: NEGATIVE
Amphetamine Screen, Ur: NEGATIVE
Barbiturate Quant, Ur: NEGATIVE
Benzodiazepines.: NEGATIVE
Benzodiazepines.: POSITIVE — AB
Benzodiazepines.: NEGATIVE
Cocaine Metabolites: NEGATIVE
Cocaine Metabolites: NEGATIVE
Cocaine Metabolites: NEGATIVE
Creatinine,U: 119.4
Ethyl Alcohol: <10
Ethyl Alcohol: <10
Ethyl Alcohol: <10
Marijuana Metabolite: NEGATIVE
Methadone: NEGATIVE
Methadone: NEGATIVE
Opiate Screen, Urine: NEGATIVE
Opiate Screen, Urine: NEGATIVE
Opiate Screen, Urine: NEGATIVE
Opiate Screen, Urine: NEGATIVE
Propoxyphene: NEGATIVE
Propoxyphene: NEGATIVE
Propoxyphene: NEGATIVE

## 2011-01-01 LAB — BENZODIAZEPINE, QUANTITATIVE, URINE
Flurazepam GC/MS Conf: NEGATIVE
Nordiazepam GC/MS Conf: NEGATIVE
Nordiazepam GC/MS Conf: NEGATIVE
Oxazepam GC/MS Conf: 110 ng/mL

## 2011-01-01 MED ORDER — CEFUROXIME AXETIL 500 MG PO TABS: 500.0000 mg | ORAL_TABLET | Freq: Two times a day (BID) | ORAL | Status: AC

## 2011-01-01 MED ORDER — NAPROXEN 500 MG PO TABS: 500.0000 mg | ORAL_TABLET | Freq: Two times a day (BID) | ORAL | Status: DC

## 2011-01-01 MED ORDER — VITAMIN B-12 1000 MCG SL SUBL: 1.0000 | SUBLINGUAL_TABLET | Freq: Every day | SUBLINGUAL | Status: DC

## 2011-01-01 MED ORDER — BUSPIRONE HCL 15 MG PO TABS: 15.0000 mg | ORAL_TABLET | Freq: Two times a day (BID) | ORAL | Status: DC

## 2011-01-01 MED ORDER — MOMETASONE FURO-FORMOTEROL FUM 200-5 MCG/ACT IN AERO: 2.0000 | INHALATION_SPRAY | Freq: Two times a day (BID) | RESPIRATORY_TRACT | Status: DC

## 2011-01-01 NOTE — Assessment & Plan Note (Signed)
Continue with current prescription therapy as reflected on the Med list.  

## 2011-01-01 NOTE — Assessment & Plan Note (Signed)
ROM exercise NSAIDs Inject later if needed

## 2011-01-01 NOTE — Assessment & Plan Note (Signed)
Ceftin Dulera MDI Stop smoking

## 2011-01-01 NOTE — Assessment & Plan Note (Signed)
Better Continue with current prescription therapy as reflected on the Med list.  

## 2011-01-01 NOTE — Progress Notes (Signed)
  Subjective:    Patient ID: Howard Gray, male    DOB: 1965-07-01, 45 y.o.   MRN: [id number]  HPI  C/o L shoulder pain 2-3 wks C/o cough x 2 weeks  Review of Systems  Constitutional: Negative for appetite change, fatigue and unexpected weight change.  HENT: Positive for congestion and rhinorrhea. Negative for nosebleeds, sore throat, sneezing, trouble swallowing and neck pain.   Eyes: Negative for itching and visual disturbance.  Respiratory: Positive for cough. Negative for shortness of breath and wheezing.   Cardiovascular: Negative for chest pain, palpitations and leg swelling.  Gastrointestinal: Negative for nausea, diarrhea, blood in stool and abdominal distention.  Genitourinary: Negative for frequency and hematuria.  Musculoskeletal: Positive for arthralgias (L shoulder). Negative for back pain, joint swelling and gait problem.  Skin: Negative for rash.  Neurological: Negative for dizziness, tremors, speech difficulty and weakness.  Psychiatric/Behavioral: Negative for sleep disturbance, dysphoric mood and agitation. The patient is not nervous/anxious.        Objective:   Physical Exam  Constitutional: He is oriented to person, place, and time. He appears well-developed.  HENT:  Mouth/Throat: Oropharynx is clear and moist.  Eyes: Conjunctivae are normal. Pupils are equal, round, and reactive to light.  Neck: Normal range of motion. No JVD present. No thyromegaly present.  Cardiovascular: Normal rate, regular rhythm, normal heart sounds and intact distal pulses.  Exam reveals no gallop and no friction rub.   No murmur heard. Pulmonary/Chest: Effort normal and breath sounds normal. No respiratory distress. He has no wheezes. He has no rales. He exhibits no tenderness.  Abdominal: Soft. Bowel sounds are normal. He exhibits no distension and no mass. There is no tenderness. There is no rebound and no guarding.  Musculoskeletal: Normal range of motion. He exhibits tenderness (L  subacr space is tender w/ROM). He exhibits no edema.  Lymphadenopathy:    He has no cervical adenopathy.  Neurological: He is alert and oriented to person, place, and time. He has normal reflexes. No cranial nerve deficit. He exhibits normal muscle tone. Coordination normal.  Skin: Skin is warm and dry. No rash noted.  Psychiatric: He has a normal mood and affect. His behavior is normal. Judgment and thought content normal.     I personally provided the inhaler use teaching. After the teaching patient was able to demonstrate it's use effectively. All questions were answered      Assessment & Plan:

## 2011-01-15 ENCOUNTER — Telehealth: Payer: Self-pay | Admitting: *Deleted

## 2011-01-15 DIAGNOSIS — K649 Unspecified hemorrhoids: Secondary | ICD-10-CM

## 2011-01-15 NOTE — Telephone Encounter (Signed)
Ok Done Thx 

## 2011-01-15 NOTE — Telephone Encounter (Signed)
Pt's wife left vm requesting referral to [facility] for pt's hemorrhoids.

## 2011-01-19 LAB — APTT: aPTT: 27

## 2011-01-19 LAB — CBC
HCT: 47.4
Hemoglobin: 16.5
MCV: 95.5
RBC: 4.96
WBC: 8.6

## 2011-01-19 LAB — BASIC METABOLIC PANEL
Chloride: 99
Creatinine, Ser: 0.94
GFR calc Af Amer: >60
Potassium: 3.7
Sodium: 135

## 2011-01-19 LAB — HEPATIC FUNCTION PANEL
ALT: 25
AST: 24
Alkaline Phosphatase: 76
Bilirubin, Direct: 0.2
Total Bilirubin: 0.5

## 2011-01-22 ENCOUNTER — Telehealth (INDEPENDENT_AMBULATORY_CARE_PROVIDER_SITE_OTHER): Payer: Self-pay | Admitting: Surgery

## 2011-01-25 ENCOUNTER — Ambulatory Visit (INDEPENDENT_AMBULATORY_CARE_PROVIDER_SITE_OTHER): Payer: BC Managed Care – PPO | Admitting: General Surgery

## 2011-01-27 ENCOUNTER — Encounter: Payer: Self-pay | Admitting: Internal Medicine

## 2011-01-27 ENCOUNTER — Ambulatory Visit (INDEPENDENT_AMBULATORY_CARE_PROVIDER_SITE_OTHER): Payer: BC Managed Care – PPO | Admitting: Internal Medicine

## 2011-01-27 VITALS — BP 140/90 | HR 80 | Temp 98.4°F | Resp 16 | Wt 195.0 lb

## 2011-01-27 DIAGNOSIS — M25519 Pain in unspecified shoulder: Secondary | ICD-10-CM

## 2011-01-28 ENCOUNTER — Encounter: Payer: Self-pay | Admitting: Internal Medicine

## 2011-01-28 MED ORDER — METHYLPREDNISOLONE ACETATE 40 MG/ML IJ SUSP: 20.0000 mg | Freq: Once | INTRAMUSCULAR | Status: AC

## 2011-01-28 MED ORDER — ALPRAZOLAM 0.5 MG PO TABS: 0.5000 mg | ORAL_TABLET | Freq: Every day | ORAL | Status: DC | PRN

## 2011-01-28 MED ORDER — BUSPIRONE HCL 15 MG PO TABS: 15.0000 mg | ORAL_TABLET | Freq: Two times a day (BID) | ORAL | Status: DC

## 2011-01-28 NOTE — Assessment & Plan Note (Signed)
L - MSK, worse 2012 L bicipital tendon tendonitis  Options to treat discussed. He asked me to inject. See meds

## 2011-01-28 NOTE — Progress Notes (Signed)
  Subjective:    Patient ID: Howard Gray, male    DOB: 04/05/1966, 45 y.o.   MRN: [id number]  HPI C/o L shoulder pain - worse now x 2 mo; no irrad   Review of Systems  Constitutional: Negative for fever and fatigue.  Respiratory: Negative for cough, chest tightness and shortness of breath.   Skin: Negative for rash and wound.       Objective:   Physical Exam  Constitutional: He appears well-developed.  HENT:  Mouth/Throat: Oropharynx is clear and moist.  Cardiovascular: Normal rate.   Pulmonary/Chest: He has no wheezes. He has no rales.  Abdominal: There is no tenderness.  Musculoskeletal: He exhibits tenderness (L bicip tendon groove is very tender). He exhibits no edema.  Neurological: No cranial nerve deficit. He exhibits normal muscle tone. Coordination normal.  Skin: No rash noted.      Procedure :Joint Injection,   shoulder   Indication:  L bicipital tendon tendonitis with refractory  chronic pain.   Risks including unsuccessful procedure , bleeding, infection, bruising, skin atrophy and others were explained to the patient in detail as well as the benefits. Informed consent was obtained and signed.   Tthe patient was placed in a comfortable position. Skin was prepped with Betadine and alcohol. Then, a 5 cc syringe with a 2 inch long 24-gauge needle was used for a tendon sheath injection.. The needle was advanced  Into the sheath space and it was injected with 2 mL of 2% lidocaine and 20 mg of Depo-Medrol .  Band-Aid was applied.   Tolerated well. Complications: None. Good pain relief following the procedure.   Postprocedure instructions :    A Band-Aid should be left on for 12 hours. Injection therapy is not a cure itself. It is used in conjunction with other modalities. You can use nonsteroidal anti-inflammatories like ibuprofen , hot and cold compresses. Rest is recommended in the next 24 hours. You need to report immediately  if fever, chills or any signs of  infection develop.       Assessment & Plan:

## 2011-02-08 ENCOUNTER — Ambulatory Visit (INDEPENDENT_AMBULATORY_CARE_PROVIDER_SITE_OTHER): Payer: BC Managed Care – PPO | Admitting: Surgery

## 2011-02-12 ENCOUNTER — Encounter (INDEPENDENT_AMBULATORY_CARE_PROVIDER_SITE_OTHER): Payer: Self-pay | Admitting: Surgery

## 2011-02-23 ENCOUNTER — Ambulatory Visit (INDEPENDENT_AMBULATORY_CARE_PROVIDER_SITE_OTHER)
Admission: RE | Admit: 2011-02-23 | Discharge: 2011-02-23 | Disposition: A | Discharge status: Home / Self Care | Payer: BC Managed Care – PPO | Source: Ambulatory Visit | Attending: Internal Medicine | Admitting: Internal Medicine

## 2011-02-23 ENCOUNTER — Telehealth: Payer: Self-pay | Admitting: *Deleted

## 2011-02-23 DIAGNOSIS — M25512 Pain in left shoulder: Secondary | ICD-10-CM

## 2011-02-23 DIAGNOSIS — M25519 Pain in unspecified shoulder: Secondary | ICD-10-CM

## 2011-02-23 NOTE — Telephone Encounter (Signed)
X ray L shoulder OV tomorrow Thx

## 2011-02-23 NOTE — Telephone Encounter (Signed)
Pt informed/order entered/OV scheduled for 02-24-11 at 4:30.

## 2011-02-23 NOTE — Telephone Encounter (Signed)
Pt is still c/o arm pain. Pt is having trouble sleeping because of this. Does he need MRI or CT? He is also concerned about bone cancer because he had a coworker who had bone cancer that presented this way. Please advise.

## 2011-02-24 ENCOUNTER — Ambulatory Visit (INDEPENDENT_AMBULATORY_CARE_PROVIDER_SITE_OTHER): Payer: BC Managed Care – PPO | Admitting: Internal Medicine

## 2011-02-24 ENCOUNTER — Encounter: Payer: Self-pay | Admitting: Internal Medicine

## 2011-02-24 DIAGNOSIS — M79609 Pain in unspecified limb: Secondary | ICD-10-CM

## 2011-02-24 MED ORDER — NAPROXEN 500 MG PO TABS: 500.0000 mg | ORAL_TABLET | Freq: Two times a day (BID) | ORAL | Status: DC

## 2011-02-24 NOTE — Patient Instructions (Signed)
Exercise for shoulder range of motion

## 2011-02-28 ENCOUNTER — Encounter: Payer: Self-pay | Admitting: Internal Medicine

## 2011-02-28 NOTE — Assessment & Plan Note (Signed)
10/12 - seems MSK He will start NSAID, exercises Ortho cons was offered See Meds

## 2011-02-28 NOTE — Progress Notes (Signed)
  Subjective:    Patient ID: Howard Gray, male    DOB: 08/11/1965, 45 y.o.   MRN: [id number]  HPI T, worse w?ROM F/u L arm pain in the shoulder and down to lower 1/3d of his humerus: constan   Review of Systems  Constitutional: Negative for fever and chills.  Respiratory: Negative for cough and shortness of breath.   Cardiovascular: Negative for chest pain.  Neurological: Negative for tremors and weakness.  Hematological: Negative for adenopathy. Does not bruise/bleed easily.  Psychiatric/Behavioral: The patient is nervous/anxious.        Objective:   Physical Exam  Constitutional: He is oriented to person, place, and time. He appears well-developed.       obese  HENT:  Mouth/Throat: Oropharynx is clear and moist.  Eyes: Conjunctivae are normal. Pupils are equal, round, and reactive to light.  Neck: Normal range of motion. No JVD present. No thyromegaly present.  Cardiovascular: Normal rate, regular rhythm, normal heart sounds and intact distal pulses.  Exam reveals no gallop and no friction rub.   No murmur heard. Pulmonary/Chest: Effort normal and breath sounds normal. No respiratory distress. He has no wheezes. He has no rales. He exhibits no tenderness.  Abdominal: Soft. Bowel sounds are normal. He exhibits no distension.  Musculoskeletal: Normal range of motion. He exhibits tenderness.  Lymphadenopathy:    He has no cervical adenopathy.  Neurological: He is alert and oriented to person, place, and time. He has normal reflexes. No cranial nerve deficit. He exhibits normal muscle tone. Coordination normal.  Skin: Skin is warm and dry. No rash noted.  Psychiatric: He has a normal mood and affect. His behavior is normal. Judgment and thought content normal.          Assessment & Plan:

## 2011-03-01 ENCOUNTER — Telehealth: Payer: Self-pay | Admitting: *Deleted

## 2011-03-01 MED ORDER — AZITHROMYCIN 250 MG PO TABS: ORAL_TABLET | ORAL | Status: AC

## 2011-03-01 NOTE — Telephone Encounter (Signed)
Pt's wife left vm on Friday- stating pt has URI. C/o sore throat, hoarse, feeling bad. He is req zpak. I called pt's wife today to see how pt is- No answer. Left mess for patient to call back.

## 2011-03-01 NOTE — Telephone Encounter (Signed)
Ok to send zpak per AVP. Rx sent. Pt informed.

## 2011-03-23 ENCOUNTER — Telehealth: Payer: Self-pay | Admitting: *Deleted

## 2011-03-23 ENCOUNTER — Telehealth: Payer: Self-pay | Admitting: Internal Medicine

## 2011-03-23 DIAGNOSIS — M25519 Pain in unspecified shoulder: Secondary | ICD-10-CM

## 2011-03-23 NOTE — Telephone Encounter (Signed)
I will sch an ortho appt - they can do an MRI in the office if needed. See my previous tel note Thx

## 2011-03-23 NOTE — Telephone Encounter (Signed)
(  I saw the previous phone note, however the pt wanted this documented)   The pt is hoping to get a referral for an MRI (due to arm pain) done as soon as possible.  The pt stated the procedure has to be done before then end of the year for insurance reasons.  Thanks so much!  (sorry for the duplicate phone note!!)

## 2011-03-23 NOTE — Telephone Encounter (Signed)
Pt's arm pain is not any better. He is requesting MRI be done ASAP. Please advise/enter order.

## 2011-03-24 NOTE — Telephone Encounter (Signed)
Pt informed

## 2011-04-09 ENCOUNTER — Other Ambulatory Visit: Payer: Self-pay | Admitting: Internal Medicine

## 2011-04-13 ENCOUNTER — Encounter: Payer: Self-pay | Admitting: Internal Medicine

## 2011-04-13 ENCOUNTER — Ambulatory Visit (INDEPENDENT_AMBULATORY_CARE_PROVIDER_SITE_OTHER): Payer: BC Managed Care – PPO | Admitting: Internal Medicine

## 2011-04-13 VITALS — BP 140/98 | HR 80 | Temp 97.3°F | Resp 16 | Wt 197.0 lb

## 2011-04-13 DIAGNOSIS — M25519 Pain in unspecified shoulder: Secondary | ICD-10-CM

## 2011-04-13 DIAGNOSIS — M545 Low back pain: Secondary | ICD-10-CM

## 2011-04-13 DIAGNOSIS — I10 Essential (primary) hypertension: Secondary | ICD-10-CM

## 2011-04-13 DIAGNOSIS — Z23 Encounter for immunization: Secondary | ICD-10-CM

## 2011-04-13 MED ORDER — TRAMADOL HCL 50 MG PO TABS: 50.0000 mg | ORAL_TABLET | Freq: Four times a day (QID) | ORAL | Status: DC | PRN

## 2011-04-13 MED ORDER — CYCLOBENZAPRINE HCL 5 MG PO TABS: 5.0000 mg | ORAL_TABLET | Freq: Two times a day (BID) | ORAL | Status: AC | PRN

## 2011-04-13 MED ORDER — NABUMETONE 750 MG PO TABS: 750.0000 mg | ORAL_TABLET | Freq: Two times a day (BID) | ORAL | Status: DC

## 2011-04-13 NOTE — Assessment & Plan Note (Signed)
Continue with current prescription therapy as reflected on the Med list.  

## 2011-04-13 NOTE — Assessment & Plan Note (Addendum)
Light duty - no lifting >10 lbs x 2 mo Cont Tramadol Start Flexeril Start Relafen ROM exercises Declined PT

## 2011-04-13 NOTE — Assessment & Plan Note (Signed)
Some better 

## 2011-04-13 NOTE — Progress Notes (Signed)
  Subjective:    Patient ID: Howard Gray, male    DOB: 1966-04-02, 46 y.o.   MRN: [id number]  HPI  C/o severe LBP w/o irrad x 2-3 wks 6-7/10 worse w/ROM C/o L shoulder pain - some better. Tramadol helped.  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  HENT: Negative for nosebleeds, congestion, sore throat, sneezing, trouble swallowing and neck pain.   Eyes: Negative for itching and visual disturbance.  Respiratory: Negative for cough.   Cardiovascular: Negative for chest pain, palpitations and leg swelling.  Gastrointestinal: Negative for nausea, diarrhea, blood in stool and abdominal distention.  Genitourinary: Negative for frequency and hematuria.  Musculoskeletal: Positive for back pain, arthralgias and gait problem. Negative for myalgias and joint swelling.  Skin: Negative for rash.  Neurological: Negative for dizziness, tremors, speech difficulty and weakness.  Psychiatric/Behavioral: Negative for suicidal ideas, sleep disturbance, dysphoric mood and agitation. The patient is not nervous/anxious.        Objective:   Physical Exam  Constitutional: He is oriented to person, place, and time. He appears well-developed.  HENT:  Mouth/Throat: Oropharynx is clear and moist.       deaf  Eyes: Conjunctivae are normal. Pupils are equal, round, and reactive to light.  Neck: Normal range of motion. No JVD present. No thyromegaly present.  Cardiovascular: Normal rate, regular rhythm, normal heart sounds and intact distal pulses.  Exam reveals no gallop and no friction rub.   No murmur heard. Pulmonary/Chest: Effort normal and breath sounds normal. No respiratory distress. He has no wheezes. He has no rales. He exhibits no tenderness.  Abdominal: Soft. Bowel sounds are normal. He exhibits no distension and no mass. There is no tenderness. There is no  rebound and no guarding.  Musculoskeletal: Normal range of motion. He exhibits tenderness (LS spine w/decr ROM, stiff, tender). He exhibits no edema.       L shoulder is less tender  Lymphadenopathy:    He has no cervical adenopathy.  Neurological: He is alert and oriented to person, place, and time. He has normal reflexes. No cranial nerve deficit. He exhibits normal muscle tone. Coordination abnormal.       str leg elev is neg B  Skin: Skin is warm and dry. No rash noted.  Psychiatric: He has a normal mood and affect. His behavior is normal. Judgment and thought content normal.          Assessment & Plan:

## 2011-04-14 ENCOUNTER — Telehealth: Payer: Self-pay | Admitting: *Deleted

## 2011-04-14 ENCOUNTER — Encounter: Payer: Self-pay | Admitting: *Deleted

## 2011-04-14 NOTE — Telephone Encounter (Signed)
Letter completed/signed/faxed to pt at [phone] per his request.

## 2011-04-14 NOTE — Telephone Encounter (Signed)
Pt's wife left vm stating pt's employer says he can not be at work while taking pain meds/muscle relaxers. 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 Please advise.

## 2011-04-27 ENCOUNTER — Ambulatory Visit (INDEPENDENT_AMBULATORY_CARE_PROVIDER_SITE_OTHER): Payer: BC Managed Care – PPO

## 2011-04-27 DIAGNOSIS — I10 Essential (primary) hypertension: Secondary | ICD-10-CM

## 2011-04-27 DIAGNOSIS — Z0279 Encounter for issue of other medical certificate: Secondary | ICD-10-CM

## 2011-04-28 ENCOUNTER — Telehealth: Payer: Self-pay | Admitting: *Deleted

## 2011-04-28 NOTE — Telephone Encounter (Signed)
Pt's wife calling stating pt did not go back to work today as the last [employer] we wrote stated. She is requesting another letter be done extending his time out of work. She also stated that he is trying to get in with Ortho and will soon be receiving short term disability. Ok to do new letter extending OOW time?

## 2011-04-28 NOTE — Telephone Encounter (Signed)
Ok to extend OOW time for 1 more week per AVP.  Also, pt needs to see ortho or PA this week. Letter done/pt'w wife informed.

## 2011-05-04 ENCOUNTER — Other Ambulatory Visit: Payer: Self-pay | Admitting: Orthopedic Surgery

## 2011-05-04 DIAGNOSIS — M5126 Other intervertebral disc displacement, lumbar region: Secondary | ICD-10-CM

## 2011-05-04 DIAGNOSIS — M545 Low back pain: Secondary | ICD-10-CM

## 2011-05-07 ENCOUNTER — Ambulatory Visit
Admission: RE | Admit: 2011-05-07 | Discharge: 2011-05-07 | Disposition: A | Discharge status: Home / Self Care | Payer: BC Managed Care – PPO | Source: Ambulatory Visit | Attending: Orthopedic Surgery | Admitting: Orthopedic Surgery

## 2011-05-07 DIAGNOSIS — M5126 Other intervertebral disc displacement, lumbar region: Secondary | ICD-10-CM

## 2011-05-07 DIAGNOSIS — M545 Low back pain: Secondary | ICD-10-CM

## 2011-05-07 MED ORDER — DIAZEPAM 5 MG PO TABS
10.0000 mg | ORAL_TABLET | Freq: Once | ORAL | Status: AC
  Administered 2011-05-07: 10 mg via ORAL

## 2011-05-07 MED ORDER — ONDANSETRON HCL 4 MG/2ML IJ SOLN: 4.0000 mg | Freq: Four times a day (QID) | INTRAMUSCULAR | Status: DC | PRN

## 2011-05-07 NOTE — Progress Notes (Addendum)
Patient states he has been off Fluvoxamine and Tramadol for the past two days. Donell Sievert, RN

## 2011-05-07 NOTE — Progress Notes (Signed)
Patient resting quietly without complaint with wife at bedside.  jkl

## 2011-06-28 ENCOUNTER — Ambulatory Visit: Payer: BC Managed Care – PPO | Admitting: Internal Medicine

## 2011-06-29 IMAGING — CT CT ABD-PELV W/ CM
2 of 3 series · 17 of 42 positions shown, 19 images · IV contrast (Omnipaque 300)
Comparison: None.

CLINICAL DATA: Generalized left upper quadrant abdominal pain for
few months.

CT ABDOMEN AND PELVIS WITH CONTRAST
TECHNIQUE: Multidetector CT imaging of the abdomen and pelvis was
performed following the standard protocol during bolus
administration of intravenous contrast.
Contrast: 80 ml 8mnipaque-WOO

[Series 3: lung 5mm · axial · 0.66mm/px · z∈[-130,-6]mm · 14 of 28 slices shown, 16 images]
[im 2/28  soft-tissue]
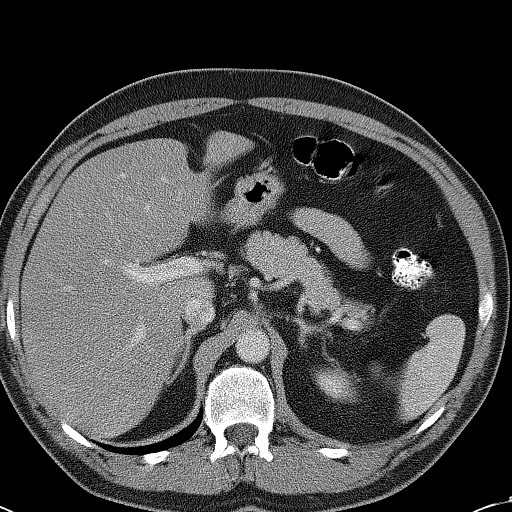
[im 2/28  bone]
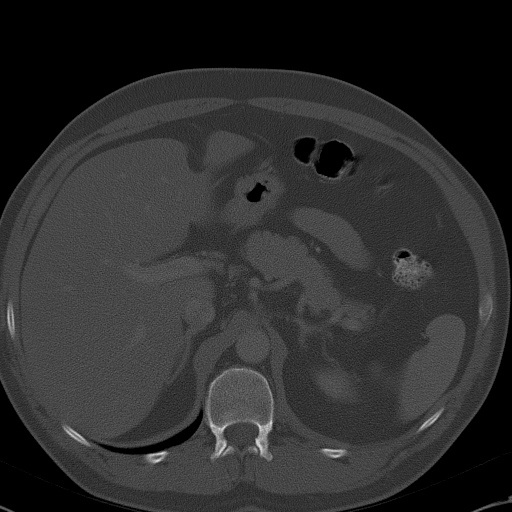
[im 4/28  soft-tissue]
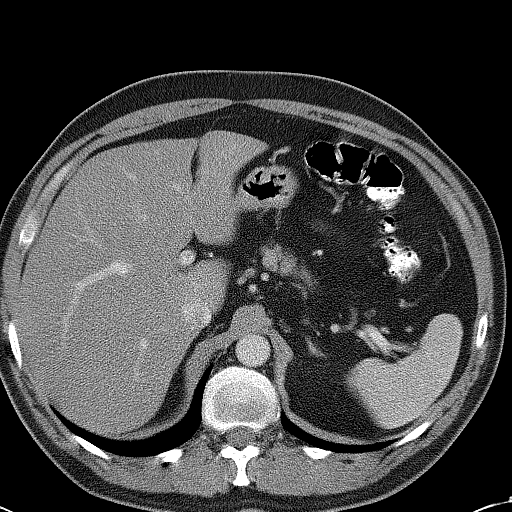
[im 6/28  soft-tissue]
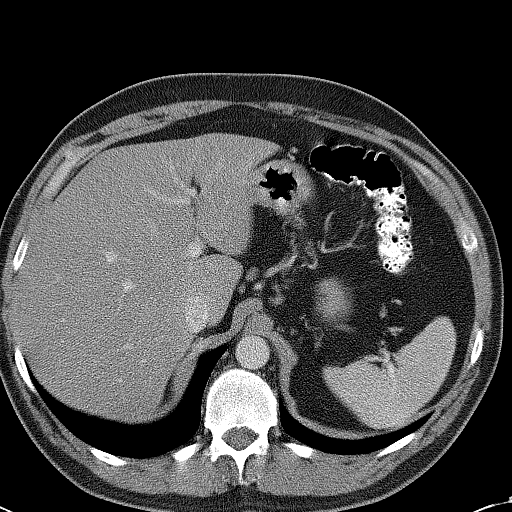
[im 8/28  soft-tissue]
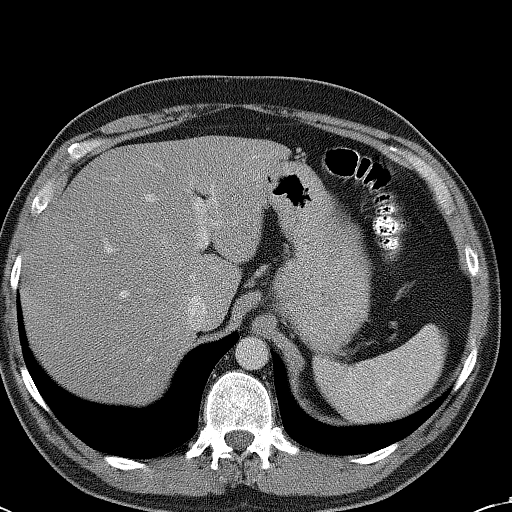
[im 10/28  soft-tissue]
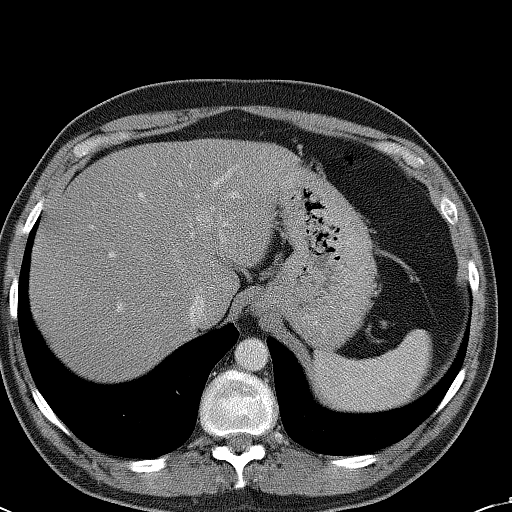
[im 12/28  soft-tissue]
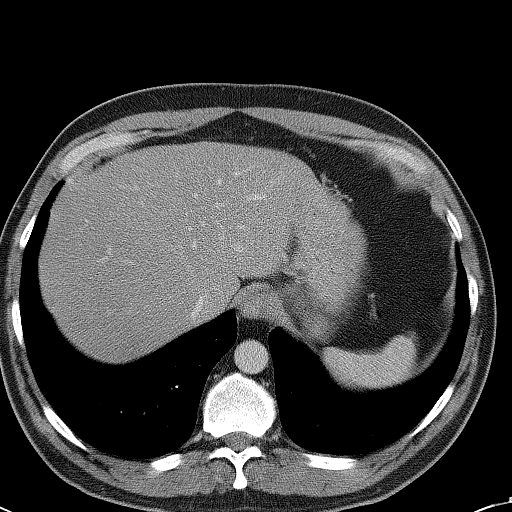
[im 14/28  soft-tissue]
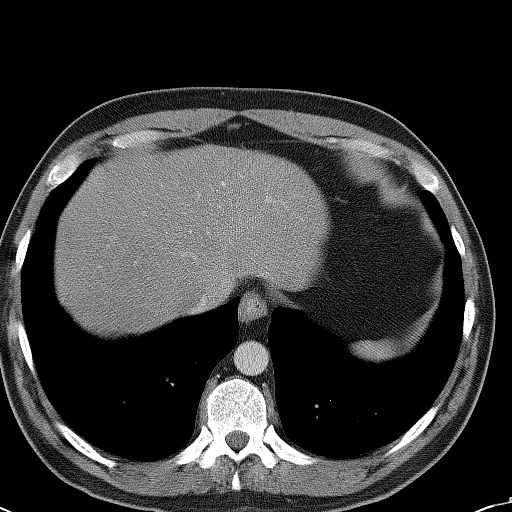
[im 15/28  soft-tissue]
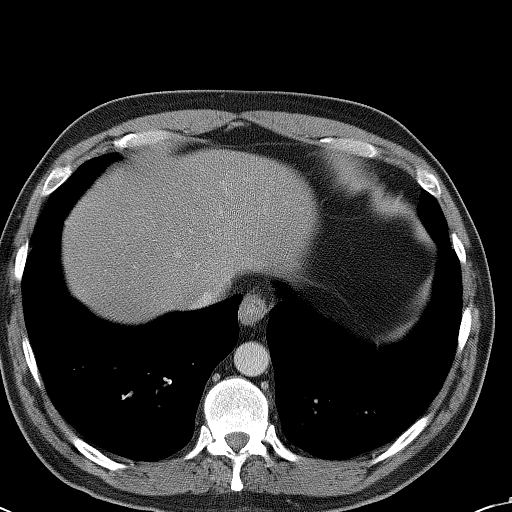
[im 17/28  soft-tissue]
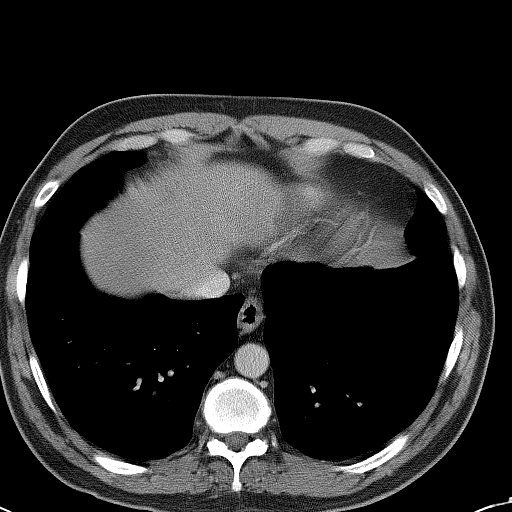
[im 17/28  bone]
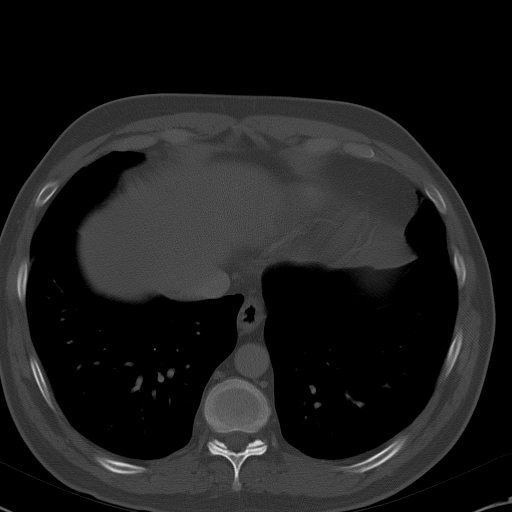
[im 19/28  soft-tissue]
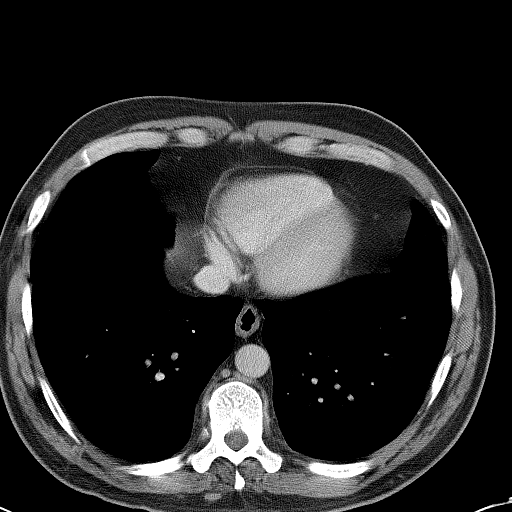
[im 21/28  soft-tissue]
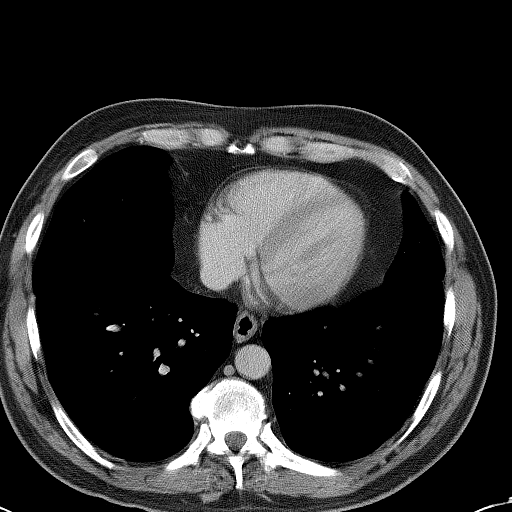
[im 23/28  soft-tissue]
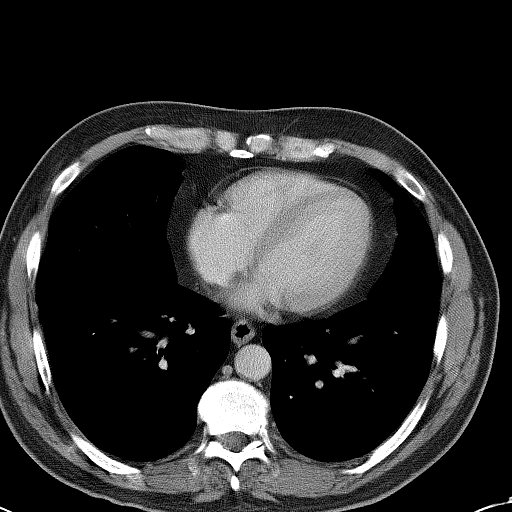
[im 25/28  soft-tissue]
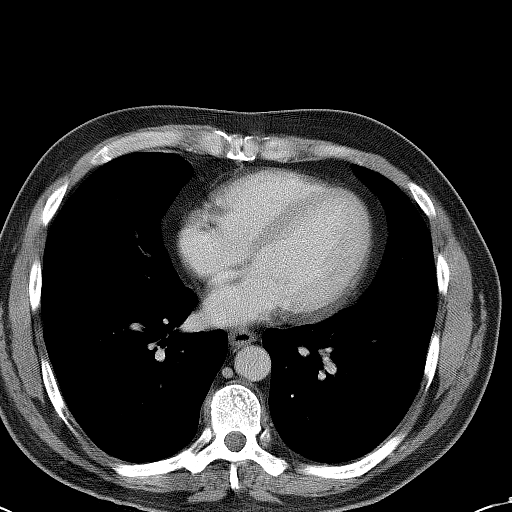
[im 27/28  soft-tissue]
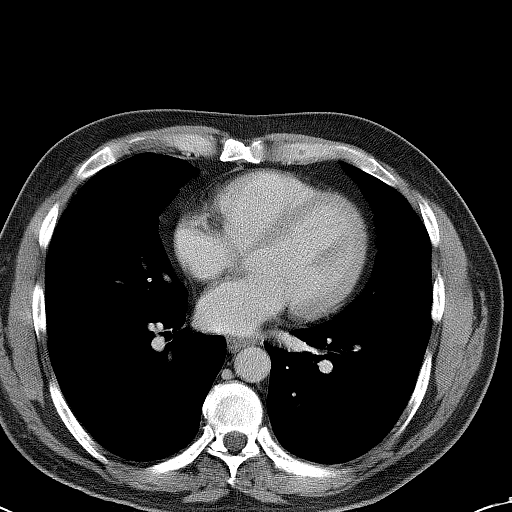

[Series 602: cor · coronal · 0.97mm/px · 3 of 121 slices shown]
[im 41/121  soft-tissue]
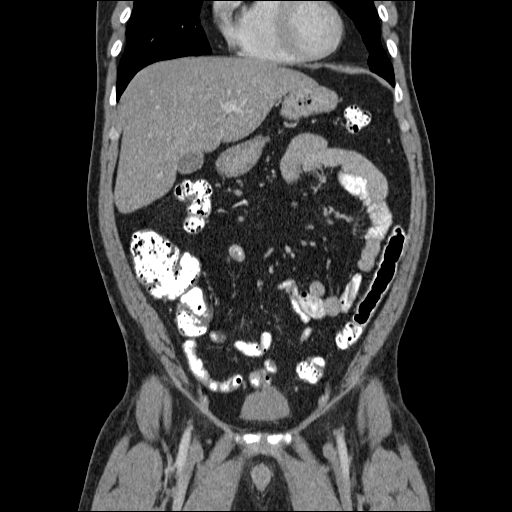
[im 54/121  soft-tissue]
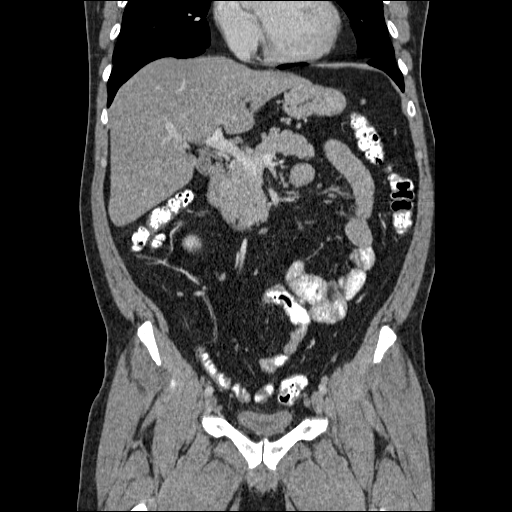
[im 67/121  soft-tissue]
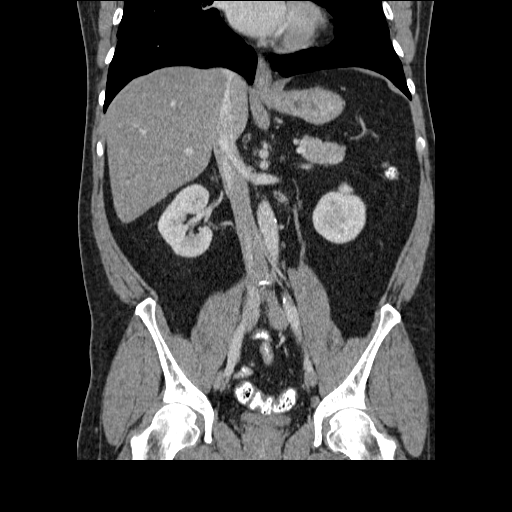

[17 of 42 positions shown; findings below may reference images not displayed]

FINDINGS: Clear lung bases.  Normal heart size without pericardial
or pleural effusion.  Mild hepatic steatosis.  Normal spleen.  The
proximal stomach is underdistended.  Normal pancreas, gallbladder,
biliary tract, adrenal glands, kidneys.

Mild narrowing of the acetic axis on image 29 transverse and 79
sagittal is likely due to impression by the median arcuate
ligament.  Other mesenteric vasculature widely patent.  A
portacaval lymph node measures upper limits of normal, 1.4 cm.
Likely related to steatosis.  No retrocrural adenopathy.

Normal colon, appendix, and terminal ileum.  Normal small bowel
without abdominal ascites.

  No pelvic adenopathy.    Normal urinary bladder and prostate.  No
significant free fluid.  No acute osseous abnormality.  Schmorl's
node deformities within the lower thoracic spine.  A prominent disc
bulge is identified at the lumbosacral junction.
IMPRESSION: 1. No acute process in the abdomen or pelvis.
2.  Hepatic steatosis.
3.  Mild narrowing of the celiac axis, likely secondary to
impression by the median arcuate ligament.

## 2011-06-30 ENCOUNTER — Other Ambulatory Visit: Payer: Self-pay | Admitting: Internal Medicine

## 2011-06-30 NOTE — Telephone Encounter (Signed)
Done

## 2011-07-14 ENCOUNTER — Encounter: Payer: Self-pay | Admitting: Gastroenterology

## 2011-08-13 ENCOUNTER — Other Ambulatory Visit (INDEPENDENT_AMBULATORY_CARE_PROVIDER_SITE_OTHER): Payer: BC Managed Care – PPO

## 2011-08-13 ENCOUNTER — Encounter: Payer: Self-pay | Admitting: Internal Medicine

## 2011-08-13 ENCOUNTER — Ambulatory Visit (INDEPENDENT_AMBULATORY_CARE_PROVIDER_SITE_OTHER): Payer: BC Managed Care – PPO | Admitting: Internal Medicine

## 2011-08-13 VITALS — BP 128/84 | HR 75 | Temp 98.1°F | Ht 69.0 in | Wt 198.2 lb

## 2011-08-13 DIAGNOSIS — R42 Dizziness and giddiness: Secondary | ICD-10-CM

## 2011-08-13 DIAGNOSIS — R51 Headache: Secondary | ICD-10-CM

## 2011-08-13 LAB — CBC WITH DIFFERENTIAL/PLATELET
Basophils Absolute: 0 10*3/uL (ref 0.0–0.1)
HCT: 45.2 % (ref 39.0–52.0)
Hemoglobin: 15.3 g/dL (ref 13.0–17.0)
Lymphs Abs: 3.2 10*3/uL (ref 0.7–4.0)
MCHC: 33.9 g/dL (ref 30.0–36.0)
MCV: 97.9 fl (ref 78.0–100.0)
Monocytes Absolute: 1 10*3/uL (ref 0.1–1.0)
Monocytes Relative: 9.7 % (ref 3.0–12.0)
Neutro Abs: 5.8 10*3/uL (ref 1.4–7.7)
Platelets: 296 10*3/uL (ref 150.0–400.0)
RDW: 13.7 % (ref 11.5–14.6)

## 2011-08-13 LAB — BASIC METABOLIC PANEL
BUN: 13 mg/dL (ref 6–23)
CO2: 28 mEq/L (ref 19–32)
Chloride: 99 mEq/L (ref 96–112)
GFR: 116.47 mL/min (ref >60.00)
Glucose, Bld: 90 mg/dL (ref 70–99)
Potassium: 4.6 mEq/L (ref 3.5–5.1)
Sodium: 137 mEq/L (ref 135–145)

## 2011-08-13 LAB — HEPATIC FUNCTION PANEL
ALT: 20 U/L (ref 0–53)
Bilirubin, Direct: 0.1 mg/dL (ref 0.0–0.3)
Total Bilirubin: 0.7 mg/dL (ref 0.3–1.2)

## 2011-08-13 LAB — TSH: TSH: 0.85 u[IU]/mL (ref 0.35–5.50)

## 2011-08-13 MED ORDER — MECLIZINE HCL 25 MG PO TABS: 25.0000 mg | ORAL_TABLET | Freq: Three times a day (TID) | ORAL | Status: AC | PRN

## 2011-08-13 NOTE — Patient Instructions (Signed)
It was good to see you today. Test(s) ordered today. Your results will be called to you after review (48-72hours after test completion). If any changes need to be made, you will be notified at that time. we'll make referral for head ct. Our office will contact you regarding appointment(s) once made. Use meclizine as needed for vertigo symptoms - Your prescription(s) have been submitted to your pharmacy. Please take as directed and contact our office if you believe you are having problem(s) with the medication(s). Please schedule followup in 3-4 weeks with [facility], call sooner if problems.

## 2011-08-13 NOTE — Progress Notes (Signed)
pts spouse informed 
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
## 2011-08-20 ENCOUNTER — Ambulatory Visit (INDEPENDENT_AMBULATORY_CARE_PROVIDER_SITE_OTHER)
Admission: RE | Admit: 2011-08-20 | Discharge: 2011-08-20 | Disposition: A | Discharge status: Home / Self Care | Payer: BC Managed Care – PPO | Source: Ambulatory Visit | Attending: Internal Medicine | Admitting: Internal Medicine

## 2011-08-20 DIAGNOSIS — R51 Headache: Secondary | ICD-10-CM

## 2011-08-20 DIAGNOSIS — R42 Dizziness and giddiness: Secondary | ICD-10-CM

## 2011-08-27 ENCOUNTER — Encounter: Payer: Self-pay | Admitting: Internal Medicine

## 2011-08-27 ENCOUNTER — Ambulatory Visit (INDEPENDENT_AMBULATORY_CARE_PROVIDER_SITE_OTHER): Payer: BC Managed Care – PPO | Admitting: Internal Medicine

## 2011-08-27 VITALS — BP 132/86 | HR 79 | Temp 98.4°F | Resp 16 | Wt 194.0 lb

## 2011-08-27 DIAGNOSIS — M79609 Pain in unspecified limb: Secondary | ICD-10-CM

## 2011-08-27 DIAGNOSIS — F1020 Alcohol dependence, uncomplicated: Secondary | ICD-10-CM

## 2011-08-27 DIAGNOSIS — M545 Low back pain, unspecified: Secondary | ICD-10-CM

## 2011-08-27 DIAGNOSIS — F17200 Nicotine dependence, unspecified, uncomplicated: Secondary | ICD-10-CM

## 2011-08-27 DIAGNOSIS — E538 Deficiency of other specified B group vitamins: Secondary | ICD-10-CM

## 2011-08-27 DIAGNOSIS — R42 Dizziness and giddiness: Secondary | ICD-10-CM

## 2011-08-27 DIAGNOSIS — J329 Chronic sinusitis, unspecified: Secondary | ICD-10-CM

## 2011-08-27 DIAGNOSIS — M25519 Pain in unspecified shoulder: Secondary | ICD-10-CM

## 2011-08-27 MED ORDER — B COMPLEX PO TABS: 1.0000 | ORAL_TABLET | Freq: Every day | ORAL | Status: DC

## 2011-08-27 MED ORDER — AMOXICILLIN-POT CLAVULANATE 875-125 MG PO TABS: 1.0000 | ORAL_TABLET | Freq: Two times a day (BID) | ORAL | Status: DC

## 2011-08-27 MED ORDER — FLUTICASONE PROPIONATE 50 MCG/ACT NA SUSP: 2.0000 | Freq: Every day | NASAL | Status: DC

## 2011-08-27 NOTE — Assessment & Plan Note (Signed)
Better, resolved after a shot

## 2011-08-27 NOTE — Assessment & Plan Note (Signed)
5/13 - worse Augmentin x 2 wks

## 2011-08-27 NOTE — Assessment & Plan Note (Signed)
Better  
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
## 2011-08-27 NOTE — Assessment & Plan Note (Signed)
discussed

## 2011-08-27 NOTE — Patient Instructions (Signed)
Don't drink

## 2011-08-27 NOTE — Assessment & Plan Note (Signed)
Discussed.

## 2011-08-30 NOTE — Assessment & Plan Note (Signed)
Continue with current prescription therapy as reflected on the Med list.  

## 2011-09-21 ENCOUNTER — Other Ambulatory Visit: Payer: Self-pay | Admitting: *Deleted

## 2011-09-21 MED ORDER — FLUTICASONE PROPIONATE 50 MCG/ACT NA SUSP: 2.0000 | Freq: Every day | NASAL | Status: DC

## 2011-10-01 ENCOUNTER — Ambulatory Visit: Payer: BC Managed Care – PPO | Admitting: Internal Medicine

## 2011-10-08 ENCOUNTER — Other Ambulatory Visit: Payer: Self-pay | Admitting: Gastroenterology

## 2011-10-08 ENCOUNTER — Telehealth: Payer: Self-pay | Admitting: *Deleted

## 2011-10-08 NOTE — Telephone Encounter (Signed)
PATIENT CALLED TO SAY HE WOULD NOT BE ABLE TO KEEP APPT. ON July 8TH AT 4 15PM WITH DR. Posey Rea. PLEASE CANCELCALLED TODAY AT 2 12PM

## 2011-10-11 ENCOUNTER — Ambulatory Visit: Payer: BC Managed Care – PPO | Admitting: Internal Medicine

## 2011-10-14 ENCOUNTER — Other Ambulatory Visit: Payer: Self-pay | Admitting: Otolaryngology

## 2011-11-26 ENCOUNTER — Ambulatory Visit (INDEPENDENT_AMBULATORY_CARE_PROVIDER_SITE_OTHER): Payer: BC Managed Care – PPO | Admitting: Internal Medicine

## 2011-11-26 ENCOUNTER — Encounter: Payer: Self-pay | Admitting: Internal Medicine

## 2011-11-26 ENCOUNTER — Telehealth: Payer: Self-pay | Admitting: Internal Medicine

## 2011-11-26 ENCOUNTER — Ambulatory Visit (INDEPENDENT_AMBULATORY_CARE_PROVIDER_SITE_OTHER)
Admission: RE | Admit: 2011-11-26 | Discharge: 2011-11-26 | Disposition: A | Discharge status: Home / Self Care | Payer: BC Managed Care – PPO | Source: Ambulatory Visit | Attending: Internal Medicine | Admitting: Internal Medicine

## 2011-11-26 VITALS — BP 132/88 | HR 72 | Temp 97.6°F | Resp 16 | Wt 194.0 lb

## 2011-11-26 DIAGNOSIS — R0789 Other chest pain: Secondary | ICD-10-CM

## 2011-11-26 DIAGNOSIS — E538 Deficiency of other specified B group vitamins: Secondary | ICD-10-CM

## 2011-11-26 DIAGNOSIS — Z87891 Personal history of nicotine dependence: Secondary | ICD-10-CM

## 2011-11-26 DIAGNOSIS — F1020 Alcohol dependence, uncomplicated: Secondary | ICD-10-CM

## 2011-11-26 DIAGNOSIS — F411 Generalized anxiety disorder: Secondary | ICD-10-CM

## 2011-11-26 DIAGNOSIS — M545 Low back pain: Secondary | ICD-10-CM

## 2011-11-26 NOTE — Assessment & Plan Note (Signed)
Continue with current prescription therapy as reflected on the Med list.  

## 2011-11-26 NOTE — Assessment & Plan Note (Signed)
Better  

## 2011-11-26 NOTE — Assessment & Plan Note (Signed)
He is not drinking He is going to [employer] at church 2/wk

## 2011-11-26 NOTE — Progress Notes (Signed)
Subjective:    Patient ID: Howard Gray, male    DOB: January 04, 1966, 46 y.o.   MRN: [id number]  F/u  diziziness off and on; head pressure - resolved F/u HA - resolved C/o chest muscles pains at times; gas-ex helps C/o anxiety, stress Drinking less now  HPI BP Readings from Last 3 Encounters:  11/26/11 132/88  08/27/11 132/86  08/13/11 128/84   Wt Readings from Last 3 Encounters:  11/26/11 194 lb (87.998 kg)  08/27/11 194 lb (87.998 kg)  08/13/11 198 lb 3.2 oz (89.903 kg)     Past Medical History  Diagnosis Date  . Anxiety   . GERD (gastroesophageal reflux disease)   . Alcoholism   . Hypertension   . Vitamin B12 deficiency   . Asthma   . Cluster headaches   . Barrett's esophagus     Review of Systems  Constitutional: Negative for appetite change, fatigue and unexpected weight change.  HENT: Negative for nosebleeds, congestion, sore throat, sneezing, trouble swallowing and neck pain.   Eyes: Negative for itching and visual disturbance.  Respiratory: Negative for cough.   Cardiovascular: Positive for chest pain. Negative for palpitations and leg swelling.  Gastrointestinal: Negative for nausea, vomiting, abdominal pain, diarrhea, blood in stool and abdominal distention.       GERD  Genitourinary: Negative for frequency and hematuria.  Musculoskeletal: Negative for back pain, joint swelling and gait problem.  Skin: Negative for rash.  Neurological: Negative for dizziness, tremors, speech difficulty and weakness.  Psychiatric/Behavioral: Negative for disturbed wake/sleep cycle, dysphoric mood and agitation. The patient is not nervous/anxious.        Objective:   Physical Exam BP 132/88  Pulse 72  Temp 97.6 F (36.4 C) (Oral)  Resp 16  Wt 194 lb (87.998 kg) Wt Readings from Last 3 Encounters:  11/26/11 194 lb (87.998 kg)  08/27/11 194 lb (87.998 kg)  08/13/11 198 lb 3.2 oz (89.903 kg)   Constitutional:  He is HOH, but appears well-developed and well-nourished.  No distress. 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 Neck supple. No JVD present. No thyromegaly present.  Cardiovascular: Normal rate, regular rhythm and normal heart sounds.  No murmur heard. no BLE edema Pulmonary/Chest: Effort normal and breath sounds normal. No respiratory distress. no wheezes. Neurological: he is alert and oriented to person, place, and time. No cranial nerve deficit. Coordination, speech, gait and balance normal. MAE with good strength, normal F>N Skin: Skin is warm and dry.  No erythema or ulceration. no laceration at site of head trauma L crown Psychiatric: he has a normal mood and affect. behavior is normal. Judgment and thought content normal.   Lab Results  Component Value Date   WBC 10.4 08/13/2011   HGB 15.3 08/13/2011   HCT 45.2 08/13/2011   PLT 296.0 08/13/2011   GLUCOSE 90 08/13/2011   CHOL 229* 04/13/2010   TRIG 160.0* 04/13/2010   HDL 60.80 04/13/2010   LDLDIRECT 156.5 04/13/2010   ALT 20 08/13/2011   AST 21 08/13/2011   NA 137 08/13/2011   K 4.6 08/13/2011   CL 99 08/13/2011   CREATININE 0.8 08/13/2011   BUN 13 08/13/2011   CO2 28 08/13/2011   TSH 0.85 08/13/2011   PSA 0.40 04/13/2010   INR 1.0 10/04/2006      Assessment & Plan:

## 2011-11-26 NOTE — Assessment & Plan Note (Signed)
8/13 - not smoking

## 2011-11-26 NOTE — Telephone Encounter (Signed)
Called pt- no answer/vm

## 2011-11-26 NOTE — Telephone Encounter (Signed)
Howard Gray, please, inform patient that his CXR was nl Thx

## 2011-11-26 NOTE — Assessment & Plan Note (Addendum)
Continue with current prescription therapy as reflected on the Med list. EKG CXR Stress test if needed

## 2011-11-30 NOTE — Telephone Encounter (Signed)
No answer, no VM, letter generated and mailed.

## 2011-12-02 ENCOUNTER — Telehealth: Payer: Self-pay | Admitting: Internal Medicine

## 2011-12-02 ENCOUNTER — Telehealth: Payer: Self-pay

## 2011-12-02 NOTE — Telephone Encounter (Signed)
Caller: Howard Gray/Patient; Phone: [phone]; Reason for Call: Patient had x-ray on 08/23 and told it showed hyperinflation of the lungs and told not to worry about it but he is worrying.

## 2011-12-02 NOTE — Telephone Encounter (Signed)
Pt advised of same.  

## 2011-12-02 NOTE — Telephone Encounter (Signed)
Please see previous note, I spoke with pt this morning and advised that MD had not made any specific recommendation regarding xray but as a smoker he should follow up with MD. Please advise.

## 2011-12-02 NOTE — Telephone Encounter (Signed)
He needs not to smoke Thx

## 2011-12-02 NOTE — Telephone Encounter (Signed)
Pt called stating he received letter with results of chest xray and is concerned about "hyperinflation" of the lungs. Pt advised that as a smoker he should consider following up with PCP but no recommendation were made by MD in regards to this. Pt expressed understanding.

## 2011-12-10 ENCOUNTER — Encounter (HOSPITAL_COMMUNITY): Payer: Self-pay

## 2011-12-10 ENCOUNTER — Emergency Department (HOSPITAL_COMMUNITY)
Admission: EM | Admit: 2011-12-10 | Discharge: 2011-12-10 | Disposition: A | Discharge status: Home / Self Care | Payer: BC Managed Care – PPO | Attending: Emergency Medicine | Admitting: Emergency Medicine

## 2011-12-10 DIAGNOSIS — F411 Generalized anxiety disorder: Secondary | ICD-10-CM | POA: Insufficient documentation

## 2011-12-10 DIAGNOSIS — E538 Deficiency of other specified B group vitamins: Secondary | ICD-10-CM | POA: Insufficient documentation

## 2011-12-10 DIAGNOSIS — Z87891 Personal history of nicotine dependence: Secondary | ICD-10-CM | POA: Insufficient documentation

## 2011-12-10 DIAGNOSIS — K219 Gastro-esophageal reflux disease without esophagitis: Secondary | ICD-10-CM | POA: Insufficient documentation

## 2011-12-10 DIAGNOSIS — I10 Essential (primary) hypertension: Secondary | ICD-10-CM | POA: Insufficient documentation

## 2011-12-10 DIAGNOSIS — H8110 Benign paroxysmal vertigo, unspecified ear: Secondary | ICD-10-CM | POA: Insufficient documentation

## 2011-12-10 DIAGNOSIS — F1020 Alcohol dependence, uncomplicated: Secondary | ICD-10-CM | POA: Insufficient documentation

## 2011-12-10 MED ORDER — MECLIZINE HCL 50 MG PO TABS: 25.0000 mg | ORAL_TABLET | Freq: Three times a day (TID) | ORAL | Status: AC | PRN

## 2011-12-10 MED ORDER — MECLIZINE HCL 25 MG PO TABS
25.0000 mg | ORAL_TABLET | Freq: Once | ORAL | Status: AC
  Administered 2011-12-10: 25 mg via ORAL
  Filled 2011-12-10: qty 1

## 2011-12-10 NOTE — ED Provider Notes (Signed)
History     CSN: [id number]  Arrival date & time 12/10/11  0919   First MD Initiated Contact with Patient 12/10/11 1025      Chief Complaint  Patient presents with  . Dizziness    (Consider location/radiation/quality/duration/timing/severity/associated sxs/prior treatment) HPI  46 year old male with history of alcohol abuse, history of anxiety, and history of cluster headache presents c/o dizziness. Pt reports while getting up this morning patient felt dizzy and lightheadedness.  Reports as "swimmy headed" but denies room spinning sensation.  He subsequently experienced sob and felt nauseated.  SOB lasted less than a min, sxs improves when laying down.  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  Denies fever, headache, neck pain, cp, v/d, abd pain, numbness or weakness.  Pt reports he has been having intermittent bouts of SOB for the past several months, sob lasting only seconds.  Nothing aggravates or alleviates it.  Has been seen by PCP and had ECG, CXR performed recently.  Sts CXR shows "hyperinflation" of his lung, which worries him.  Pt reports he has not had any recent medication changes, and no alcohol use for more than a month.  Denies any recent sickness. Denies recent surg, prolonged bed rest, or long trip.  No calf swelling or pain.    Past Medical History  Diagnosis Date  . Anxiety   . GERD (gastroesophageal reflux disease)   . Alcoholism   . Hypertension   . Vitamin B12 deficiency   . Cluster headaches   . Barrett's esophagus     Past Surgical History  Procedure Date  . Cochlear implant   . Other surgical history     endoscopy    Family History  Problem Relation Age of Onset  . Hypertension    . Colon cancer Neg Hx   . Heart disease Father   . Stroke Mother     History  Substance Use Topics  . Smoking status: Former Smoker -- 0.3 packs/day    Types: Cigarettes  . Smokeless tobacco: Former Neurosurgeon    Quit date: 11/26/2011  . Alcohol Use: Yes    hx of alcoholism      Review of Systems  All other systems reviewed and are negative.    Allergies  Review of patient's allergies indicates no known allergies.  Home Medications   Current Outpatient Rx  Name Route Sig Dispense Refill  . ALPRAZOLAM 0.5 MG PO TABS Oral Take 0.25-0.5 mg by mouth daily as needed. For anxiety    . AMLODIPINE-OLMESARTAN 10-40 MG PO TABS Oral Take 1 tablet by mouth daily.    . B COMPLEX PO TABS Oral Take 1 tablet by mouth daily.    Marland Kitchen VITAMIN D3 1000 UNITS PO CAPS Oral Take 2 tablets by mouth daily.     Marland Kitchen VITAMIN B-12 1000 MCG SL SUBL Sublingual Place 1 tablet under the tongue daily.    Marland Kitchen FLUVOXAMINE MALEATE 100 MG PO TABS Oral Take 200 mg by mouth at bedtime.    Marland Kitchen LANSOPRAZOLE 30 MG PO CPDR Oral Take 30 mg by mouth 2 (two) times daily.      BP 122/81  Pulse 77  Temp 99.4 F (37.4 C) (Oral)  Resp 15  Ht 5\' 9"  (1.753 m)  Wt 194 lb (87.998 kg)  BMI 28.65 kg/m2  SpO2 96%  Physical Exam  Nursing note and vitals reviewed. Constitutional: He is oriented to person, place, and time. He appears well-developed and well-nourished.  No distress.       Awake, alert, nontoxic appearance  HENT:  Head: Atraumatic.  Right Ear: External ear normal.  Left Ear: External ear normal.  Mouth/Throat: Oropharynx is clear and moist.  Eyes: Conjunctivae and EOM are normal. Pupils are equal, round, and reactive to light. Right eye exhibits no discharge. Left eye exhibits no discharge.       fatigueable horizontal nystagmus favoring L side.  Neck: Normal range of motion. Neck supple.  Cardiovascular: Normal rate and regular rhythm.   Pulmonary/Chest: Effort normal. No respiratory distress. He has no wheezes. He has no rales. He exhibits no tenderness.  Abdominal: Soft. There is no tenderness. There is no rebound.  Musculoskeletal: Normal range of motion. He exhibits no tenderness.       ROM appears intact, no obvious focal weakness  Neurological: He is alert and  oriented to person, place, and time.        A&O x 3, sppech clear, cognition appears to be normal, CN II-XII grossly normal - face symmetric, no deviation tongue, PERRLA EOMI, normal shoulder shrug; MS 5/5 throughout; DTRs 2+ and symmetrical; cerebellar - normal finger-nose, heel-shin, rapid alternating finger movement, nl gait;    Skin: Skin is warm and dry. No rash noted.  Psychiatric: He has a normal mood and affect.    ED Course  Procedures (including critical care time)  Labs Reviewed - No data to display No results found.   No diagnosis found.   Date: 12/10/2011  Rate: 80  Rhythm: normal sinus rhythm  QRS Axis: normal  Intervals: normal  ST/T Wave abnormalities: normal  Conduction Disutrbances: none  Narrative Interpretation:   Old EKG Reviewed: No significant changes noted   1. BPPV  MDM  Dizziness with SOB that is likely related to anxiety.  Has fatigueable horizontal nystagmus on exam.  Increase dizziness with positional changes.  Doubt central vertigo.  No focal neuro deficits.  Lung exam unremarkable.  ECG normal.  VSS.  Positive Dix-Hallpike    12:38 PM Pt reports improvement after receiving meclizine.  Pt able to ambulate with out difficulty.  Sts he feels stable to go home.  Agrees to f/u with PCP.  Will d/c with meclizine.  Strict return precaution discussed.      Fayrene Helper, PA-C 12/10/11 1239

## 2011-12-10 NOTE — ED Notes (Signed)
Patient was brought in by ambulance with complaint of dizziness, lightheadedness onset this morning with SOB and nausea. Patient denies any chest pain, no fever.

## 2011-12-14 ENCOUNTER — Ambulatory Visit: Payer: BC Managed Care – PPO | Admitting: Endocrinology

## 2011-12-14 NOTE — ED Provider Notes (Signed)
Medical screening examination/treatment/procedure(s) were performed by non-physician practitioner and as supervising physician I was immediately available for consultation/collaboration.  Markise Haymer R. Sylvan Sookdeo, MD 12/14/11 2009 

## 2011-12-16 ENCOUNTER — Ambulatory Visit (INDEPENDENT_AMBULATORY_CARE_PROVIDER_SITE_OTHER): Payer: BC Managed Care – PPO | Admitting: Internal Medicine

## 2011-12-16 ENCOUNTER — Encounter: Payer: Self-pay | Admitting: Internal Medicine

## 2011-12-16 VITALS — BP 108/70 | HR 77 | Temp 98.0°F | Ht 69.0 in | Wt 196.5 lb

## 2011-12-16 DIAGNOSIS — I10 Essential (primary) hypertension: Secondary | ICD-10-CM

## 2011-12-16 DIAGNOSIS — R42 Dizziness and giddiness: Secondary | ICD-10-CM

## 2011-12-16 DIAGNOSIS — H9190 Unspecified hearing loss, unspecified ear: Secondary | ICD-10-CM

## 2011-12-16 DIAGNOSIS — M545 Low back pain, unspecified: Secondary | ICD-10-CM

## 2011-12-16 DIAGNOSIS — H9319 Tinnitus, unspecified ear: Secondary | ICD-10-CM

## 2011-12-16 NOTE — Assessment & Plan Note (Signed)
prob right lumbar paravertebral spasm - for flexeril prn,  to f/u any worsening symptoms or concerns

## 2011-12-16 NOTE — Progress Notes (Signed)
Subjective:    Patient ID: Howard Gray, male    DOB: 06/11/1965, 46 y.o.   MRN: [id number]  HPI  Here to f/u as PCP is unavailable today;  Was seen recently in ER with episode vertigo, documented as ? Related to anxiety but pt states tx with antivert but has not had to use, in fact has had no further dizziness at all despite ongoing anxiety;  Denies worsening depressive symptoms, suicidal ideation, or panic.  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  Also incidentally with Pt continues to have recurring right LBP without change in severity, mild for 2-3 wks, no bowel or bladder change, fever, wt loss,  worsening LE pain/numbness/weakness, gait change or falls. Feels like ? Muscle spasm.   Pt denies fever, wt loss, night sweats, loss of appetite, or other constitutional symptoms Past Medical History  Diagnosis Date  . Anxiety   . GERD (gastroesophageal reflux disease)   . Alcoholism   . Hypertension   . Vitamin B12 deficiency   . Cluster headaches   . Barrett's esophagus    Past Surgical History  Procedure Date  . Cochlear implant   . Other surgical history     endoscopy    reports that he has quit smoking. His smoking use included Cigarettes. He smoked .3 packs per day. He quit smokeless tobacco use about 2 weeks ago. He reports that he drinks alcohol. He reports that he does not use illicit drugs. family history includes Heart disease in his father; Hypertension in an unspecified family member; and Stroke in his mother.  There is no history of Colon cancer. No Known Allergies Current Outpatient Prescriptions on File Prior to Visit  Medication Sig Dispense Refill  . ALPRAZolam (XANAX) 0.5 MG tablet Take 0.25-0.5 mg by mouth daily as needed. For anxiety      . amLODipine-olmesartan (AZOR) 10-40 MG per tablet Take 1 tablet by mouth daily.      Marland Kitchen b complex vitamins tablet Take 1 tablet by mouth daily.      .  Cholecalciferol (VITAMIN D3) 1000 UNITS CAPS Take 2 tablets by mouth daily.       . Cyanocobalamin (VITAMIN B-12) 1000 MCG SUBL Place 1 tablet under the tongue daily.      . fluvoxaMINE (LUVOX) 100 MG tablet Take 200 mg by mouth at bedtime.      . lansoprazole (PREVACID) 30 MG capsule Take 30 mg by mouth 2 (two) times daily.      . meclizine (ANTIVERT) 50 MG tablet Take 0.5 tablets (25 mg total) by mouth 3 (three) times daily as needed for dizziness or nausea.  20 tablet  0   Current Facility-Administered Medications on File Prior to Visit  Medication Dose Route Frequency Provider Last Rate Last Dose  . methylPREDNISolone acetate (DEPO-MEDROL) injection 20 mg  20 mg Intra-Lesional Once Tresa Garter, MD       Review of Systems Review of Systems  Constitutional: Negative for diaphoresis and unexpected weight change.  HENT: Negative for drooling and tinnitus.   Eyes: Negative for photophobia and visual disturbance.  Respiratory: Negative for choking and stridor.   Gastrointestinal: Negative for vomiting and blood in stool.  Genitourinary: Negative for hematuria and decreased urine volume.  Musculoskeletal: Negative for gait problem.  Skin: Negative for color change and wound.  Neurological: Negative for tremors and numbness.  Psychiatric/Behavioral: Negative for decreased concentration. The patient is not hyperactive.  Objective:   Physical Exam BP 108/70  Pulse 77  Temp 98 F (36.7 C) (Oral)  Ht 5\' 9"  (1.753 m)  Wt 196 lb 8 oz (89.132 kg)  BMI 29.02 kg/m2  SpO2 96% Physical Exam  VS noted, no ill appearing Constitutional: Pt appears well-developed and well-nourished.  HENT: Head: Normocephalic.  Right Ear: External ear normal.  Left Ear: External ear normal.  Bilat tm's no erythema.  Sinus nontender.  Pharynx mild erythema Eyes: Conjunctivae and EOM are normal. Pupils are equal, round, and reactive to light.  Neck: Normal range of motion. Neck supple.    Cardiovascular: Normal rate and regular rhythm.   Pulmonary/Chest: Effort normal and breath sounds normal.  Abd:  Soft, NT, non-distended, + BS Spine nontender, right paravertebral tender Neurological: Pt is alert. No cranial nerve defecit. motor grossly intact, gait intact Skin: Skin is warm. No erythema. No rash Psychiatric: Pt behavior is normal. Thought content normal. 1-2+ nervous    Assessment & Plan:

## 2011-12-16 NOTE — Assessment & Plan Note (Signed)
In the setting of hearing loss, tinnitus   - ? Meniere's - to ENT for evaluation

## 2011-12-16 NOTE — Patient Instructions (Addendum)
Continue all other medications as before You will be contacted regarding the referral for: ENT for ? Of Menierre's disease with your hearing loss, vertigo and tinnitus (ringing)

## 2011-12-16 NOTE — Assessment & Plan Note (Signed)
stable overall by hx and exam, most recent data reviewed with pt, and pt to continue medical treatment as before BP Readings from Last 3 Encounters:  12/16/11 108/70  12/10/11 122/81  11/26/11 132/88

## 2012-01-04 ENCOUNTER — Other Ambulatory Visit: Payer: Self-pay | Admitting: Internal Medicine

## 2012-02-12 ENCOUNTER — Other Ambulatory Visit: Payer: Self-pay | Admitting: Internal Medicine

## 2012-02-25 ENCOUNTER — Ambulatory Visit: Payer: BC Managed Care – PPO | Admitting: Internal Medicine

## 2012-03-28 ENCOUNTER — Other Ambulatory Visit: Payer: Self-pay | Admitting: Family Medicine

## 2012-03-28 DIAGNOSIS — M549 Dorsalgia, unspecified: Secondary | ICD-10-CM

## 2012-04-18 ENCOUNTER — Other Ambulatory Visit: Payer: BC Managed Care – PPO

## 2012-05-25 ENCOUNTER — Ambulatory Visit: Payer: BC Managed Care – PPO | Admitting: Internal Medicine

## 2012-06-01 ENCOUNTER — Telehealth: Payer: Self-pay | Admitting: *Deleted

## 2012-06-01 NOTE — Telephone Encounter (Signed)
Done

## 2012-06-01 NOTE — Telephone Encounter (Signed)
Message copied by Merrilyn Puma on Thu Jun 01, 2012  9:00 AM ------      Message from: Etheleen Sia      Created: Thu Jun 01, 2012  8:43 AM      Regarding: LAB       PHYSICAL LABS FOR A MARCH 5 -NEXT WEEK APPT ------

## 2012-06-07 ENCOUNTER — Encounter: Payer: BC Managed Care – PPO | Admitting: Internal Medicine

## 2012-06-23 ENCOUNTER — Other Ambulatory Visit: Payer: Self-pay | Admitting: Gastroenterology

## 2012-06-26 ENCOUNTER — Encounter: Payer: Self-pay | Admitting: Internal Medicine

## 2012-06-26 ENCOUNTER — Ambulatory Visit (INDEPENDENT_AMBULATORY_CARE_PROVIDER_SITE_OTHER): Payer: BC Managed Care – PPO | Admitting: Internal Medicine

## 2012-06-26 VITALS — BP 140/80 | HR 76 | Temp 98.3°F | Resp 16 | Ht 69.0 in | Wt 194.0 lb

## 2012-06-26 DIAGNOSIS — M545 Low back pain, unspecified: Secondary | ICD-10-CM

## 2012-06-26 DIAGNOSIS — R0789 Other chest pain: Secondary | ICD-10-CM

## 2012-06-26 DIAGNOSIS — E538 Deficiency of other specified B group vitamins: Secondary | ICD-10-CM

## 2012-06-26 DIAGNOSIS — G250 Essential tremor: Secondary | ICD-10-CM

## 2012-06-26 DIAGNOSIS — Z87891 Personal history of nicotine dependence: Secondary | ICD-10-CM

## 2012-06-26 DIAGNOSIS — I10 Essential (primary) hypertension: Secondary | ICD-10-CM

## 2012-06-26 DIAGNOSIS — F1020 Alcohol dependence, uncomplicated: Secondary | ICD-10-CM

## 2012-06-26 DIAGNOSIS — F411 Generalized anxiety disorder: Secondary | ICD-10-CM

## 2012-06-26 DIAGNOSIS — Z0000 Encounter for general adult medical examination without abnormal findings: Secondary | ICD-10-CM

## 2012-06-26 DIAGNOSIS — Z23 Encounter for immunization: Secondary | ICD-10-CM

## 2012-06-26 MED ORDER — FLUVOXAMINE MALEATE 100 MG PO TABS: 100.0000 mg | ORAL_TABLET | Freq: Two times a day (BID) | ORAL | Status: AC

## 2012-06-26 MED ORDER — ALPRAZOLAM 0.5 MG PO TABS: 0.2500 mg | ORAL_TABLET | Freq: Every day | ORAL | Status: DC | PRN

## 2012-06-26 MED ORDER — LANSOPRAZOLE 30 MG PO CPDR: 30.0000 mg | DELAYED_RELEASE_CAPSULE | Freq: Two times a day (BID) | ORAL | Status: DC

## 2012-06-26 NOTE — Assessment & Plan Note (Signed)
No change 

## 2012-06-26 NOTE — Assessment & Plan Note (Signed)
No relapse 

## 2012-06-26 NOTE — Assessment & Plan Note (Signed)
Continue with current prescription therapy as reflected on the Med list.  

## 2012-06-26 NOTE — Assessment & Plan Note (Signed)
We discussed age appropriate health related issues, including available/recomended screening tests and vaccinations. We discussed a need for adhering to healthy diet and exercise. Labs/EKG were reviewed/ordered. All questions were answered.   

## 2012-06-26 NOTE — Assessment & Plan Note (Signed)
Discussed.

## 2012-06-26 NOTE — Progress Notes (Deleted)
Patient ID: Howard Gray, male   DOB: February 17, 1966, 47 y.o.   MRN: [id number]  Subjective:    Patient ID: Howard Gray, male    DOB: Sep 05, 1965, 47 y.o.   MRN: [id number]  HPI  Here to f/u as PCP is unavailable today;  Was seen recently in ER with episode vertigo, documented as ? Related to anxiety but pt states tx with antivert but has not had to use, in fact has had no further dizziness at all despite ongoing anxiety;  Denies worsening depressive symptoms, suicidal ideation, or panic.  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  Also incidentally with Pt continues to have recurring right LBP without change in severity, mild for 2-3 wks, no bowel or bladder change, fever, wt loss,  worsening LE pain/numbness/weakness, gait change or falls. Feels like ? Muscle spasm.   Pt denies fever, wt loss, night sweats, loss of appetite, or other constitutional symptoms Past Medical History  Diagnosis Date  . Anxiety   . GERD (gastroesophageal reflux disease)   . Alcoholism   . Hypertension   . Vitamin B12 deficiency   . Cluster headaches   . Barrett's esophagus    Past Surgical History  Procedure Laterality Date  . Cochlear implant    . Other surgical history      endoscopy    reports that he has quit smoking. His smoking use included Cigarettes. He smoked 0.30 packs per day. He quit smokeless tobacco use about 7 months ago. He reports that  drinks alcohol. He reports that he does not use illicit drugs. family history includes Heart disease in his father; Hypertension in an unspecified family member; and Stroke in his mother.  There is no history of Colon cancer. No Known Allergies Current Outpatient Prescriptions on File Prior to Visit  Medication Sig Dispense Refill  . ALPRAZolam (XANAX) 0.5 MG tablet Take 0.25-0.5 mg by mouth daily as needed. For anxiety      . AZOR 10-40 MG per tablet TAKE 1 TABLET BY MOUTH EVERY DAY FOR BLOOD  PRESSURE  90 tablet  2  . b complex vitamins tablet Take 1 tablet by mouth daily.      . Cholecalciferol (VITAMIN D3) 1000 UNITS CAPS Take 2 tablets by mouth daily.       . Cyanocobalamin (VITAMIN B-12) 1000 MCG SUBL Place 1 tablet under the tongue daily.      . fluvoxaMINE (LUVOX) 100 MG tablet TAKE 2 TABLETS EVERY DAY  180 tablet  1  . lansoprazole (PREVACID) 30 MG capsule TAKE 1 CAPSULE (30 MG TOTAL) BY MOUTH 2 (TWO) TIMES DAILY BEFORE A MEAL.  60 capsule  1   Current Facility-Administered Medications on File Prior to Visit  Medication Dose Route Frequency Provider Last Rate Last Dose  . methylPREDNISolone acetate (DEPO-MEDROL) injection 20 mg  20 mg Intra-Lesional Once Tresa Garter, MD       Review of Systems Review of Systems  Constitutional: Negative for diaphoresis and unexpected weight change.  HENT: Negative for drooling and tinnitus.   Eyes: Negative for photophobia and visual disturbance.  Respiratory: Negative for choking and stridor.   Gastrointestinal: Negative for vomiting and blood in stool.  Genitourinary: Negative for hematuria and decreased urine volume.  Musculoskeletal: Negative for gait problem.  Skin: Negative for color change and wound.  Neurological: Negative for tremors and numbness.  Psychiatric/Behavioral: Negative for decreased concentration. The patient is not hyperactive.  Objective:   Physical Exam BP 140/80  Pulse 76  Temp(Src) 98.3 F (36.8 C) (Oral)  Resp 16  Ht 5\' 9"  (1.753 m)  Wt 194 lb (87.998 kg)  BMI 28.64 kg/m2 Physical Exam  VS noted, no ill appearing Constitutional: Pt appears well-developed and well-nourished.  HENT: Head: Normocephalic.  Right Ear: External ear normal.  Left Ear: External ear normal.  Bilat tm's no erythema.  Sinus nontender.  Pharynx mild erythema Eyes: Conjunctivae and EOM are normal. Pupils are equal, round, and reactive to light.  Neck: Normal range of motion. Neck supple.  Cardiovascular: Normal  rate and regular rhythm.   Pulmonary/Chest: Effort normal and breath sounds normal.  Abd:  Soft, NT, non-distended, + BS Spine nontender, right paravertebral tender Neurological: Pt is alert. No cranial nerve defecit. motor grossly intact, gait intact Skin: Skin is warm. No erythema. No rash Psychiatric: Pt behavior is normal. Thought content normal. 1-2+ nervous    Assessment & Plan:

## 2012-06-26 NOTE — Assessment & Plan Note (Signed)
Better  

## 2012-06-26 NOTE — Assessment & Plan Note (Signed)
Continue with current prescription therapy as reflected on the Med list. Better 

## 2012-06-27 ENCOUNTER — Other Ambulatory Visit (INDEPENDENT_AMBULATORY_CARE_PROVIDER_SITE_OTHER): Payer: BC Managed Care – PPO

## 2012-06-27 DIAGNOSIS — Z0000 Encounter for general adult medical examination without abnormal findings: Secondary | ICD-10-CM

## 2012-06-27 DIAGNOSIS — Z125 Encounter for screening for malignant neoplasm of prostate: Secondary | ICD-10-CM

## 2012-06-27 LAB — CBC WITH DIFFERENTIAL/PLATELET
Basophils Absolute: 0 10*3/uL (ref 0.0–0.1)
Hemoglobin: 16.4 g/dL (ref 13.0–17.0)
Lymphocytes Relative: 26.9 % (ref 12.0–46.0)
Monocytes Relative: 11.3 % (ref 3.0–12.0)
Neutro Abs: 4.3 10*3/uL (ref 1.4–7.7)
Platelets: 307 10*3/uL (ref 150.0–400.0)
RDW: 12.8 % (ref 11.5–14.6)

## 2012-06-27 LAB — TSH: TSH: 1.1 u[IU]/mL (ref 0.35–5.50)

## 2012-06-27 LAB — URINALYSIS, ROUTINE W REFLEX MICROSCOPIC
Bilirubin Urine: NEGATIVE
Ketones, ur: NEGATIVE
Leukocytes, UA: NEGATIVE
Urobilinogen, UA: 0.2 (ref 0.0–1.0)

## 2012-06-27 LAB — BASIC METABOLIC PANEL
BUN: 15 mg/dL (ref 6–23)
Calcium: 9.6 mg/dL (ref 8.4–10.5)
Creatinine, Ser: 0.9 mg/dL (ref 0.4–1.5)
GFR: 94.47 mL/min (ref >60.00)

## 2012-06-27 LAB — LIPID PANEL
Cholesterol: 217 mg/dL — ABNORMAL HIGH (ref 0–200)
HDL: 57.3 mg/dL (ref >39.00)
Triglycerides: 96 mg/dL (ref 0.0–149.0)
VLDL: 19.2 mg/dL (ref 0.0–40.0)

## 2012-06-27 LAB — PSA: PSA: 0.63 ng/mL (ref 0.10–4.00)

## 2012-06-29 NOTE — Progress Notes (Signed)
  Subjective:    Patient ID: Howard Gray, male    DOB: [birth year], 47 y.o.   MRN: [id number]  HPI   The patient is here for a wellness exam. The patient has been doing well overall without major physical or psychological issues going on lately  BP Readings from Last 3 Encounters:  06/26/12 140/80  12/16/11 108/70  12/10/11 122/81   Wt Readings from Last 3 Encounters:  06/26/12 194 lb (87.998 kg)  12/16/11 196 lb 8 oz (89.132 kg)  12/10/11 194 lb (87.998 kg)       .Review of Systems  Constitutional: Negative for appetite change, fatigue and unexpected weight change.  HENT: Negative for nosebleeds, congestion, sore throat, sneezing, mouth sores, trouble swallowing, neck pain, voice change and tinnitus.   Eyes: Negative for redness, itching and visual disturbance.  Respiratory: Negative for cough and wheezing.   Cardiovascular: Negative for chest pain, palpitations and leg swelling.  Gastrointestinal: Negative for nausea, diarrhea, blood in stool and abdominal distention.  Genitourinary: Negative for frequency, hematuria and difficulty urinating.  Musculoskeletal: Negative for back pain, joint swelling and gait problem.  Skin: Negative for rash.  Neurological: Negative for dizziness, tremors, speech difficulty and weakness.  Psychiatric/Behavioral: Negative for suicidal ideas, hallucinations, behavioral problems, sleep disturbance, self-injury, dysphoric mood and agitation. The patient is not nervous/anxious and is not hyperactive.        Objective:   Physical Exam  Constitutional: He is oriented to person, place, and time. He appears well-developed and well-nourished. No distress.  HENT:  Mouth/Throat: Oropharynx is clear and moist.  Eyes: Conjunctivae are normal. Pupils are equal, round, and reactive to light. Left eye exhibits no discharge.  Hard hearing  Neck: Normal range of motion. No JVD present. No thyromegaly present.  Cardiovascular: Normal rate, regular rhythm,  normal heart sounds and intact distal pulses.  Exam reveals no gallop and no friction rub.   No murmur heard. Pulmonary/Chest: Effort normal and breath sounds normal. No respiratory distress. He has no wheezes. He has no rales. He exhibits no tenderness.  Abdominal: Soft. Bowel sounds are normal. He exhibits no distension and no mass. There is no tenderness. There is no rebound and no guarding.  Musculoskeletal: Normal range of motion. He exhibits no edema and no tenderness.  Lymphadenopathy:    He has no cervical adenopathy.  Neurological: He is alert and oriented to person, place, and time. He has normal reflexes. No cranial nerve deficit. He exhibits normal muscle tone. Coordination normal.  Skin: Skin is warm and dry. No rash noted.  Psychiatric: He has a normal mood and affect. His behavior is normal. Judgment and thought content normal.     Lab Results  Component Value Date   WBC 7.6 06/27/2012   HGB 16.4 06/27/2012   HCT 47.5 06/27/2012   PLT 307.0 06/27/2012   GLUCOSE 97 06/27/2012   CHOL 217* 06/27/2012   TRIG 96.0 06/27/2012   HDL 57.30 06/27/2012   LDLDIRECT 140.7 06/27/2012   ALT 20 08/13/2011   AST 21 08/13/2011   NA 138 06/27/2012   K 4.4 06/27/2012   CL 99 06/27/2012   CREATININE 0.9 06/27/2012   BUN 15 06/27/2012   CO2 30 06/27/2012   TSH 1.10 06/27/2012   PSA 0.63 06/27/2012   INR 1.0 10/04/2006        Assessment & Plan:

## 2012-07-12 ENCOUNTER — Encounter: Payer: Self-pay | Admitting: Gastroenterology

## 2012-09-20 ENCOUNTER — Other Ambulatory Visit: Payer: Self-pay | Admitting: Internal Medicine

## 2012-09-20 NOTE — Telephone Encounter (Signed)
Please advise refill? 

## 2012-09-21 ENCOUNTER — Other Ambulatory Visit: Payer: Self-pay | Admitting: Internal Medicine

## 2012-09-21 IMAGING — CR DG CHEST 2V
2 series · 2 of 2 positions shown · non-contrast
Comparison: 11/17/2008

CLINICAL DATA: Chest pain, smoker.

CHEST - 2 VIEW

[view not recorded (1 of 2)]
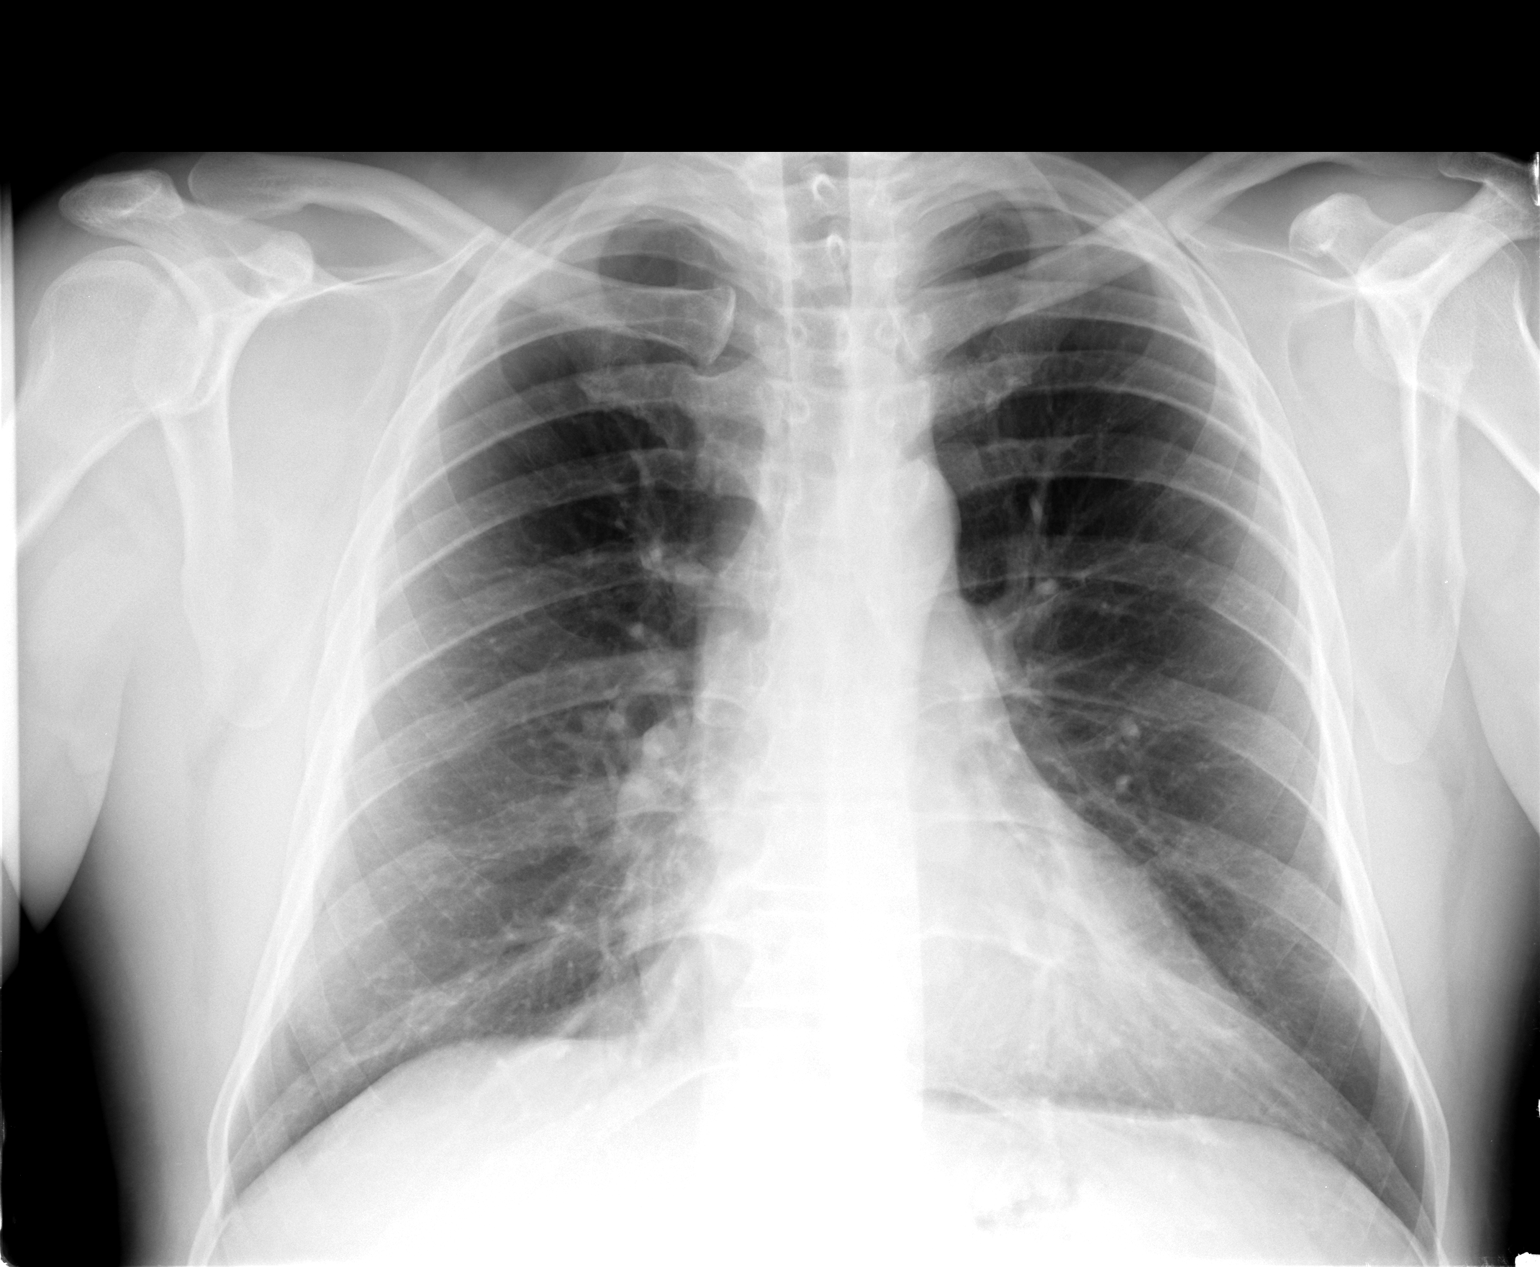

[view not recorded (2 of 2)]
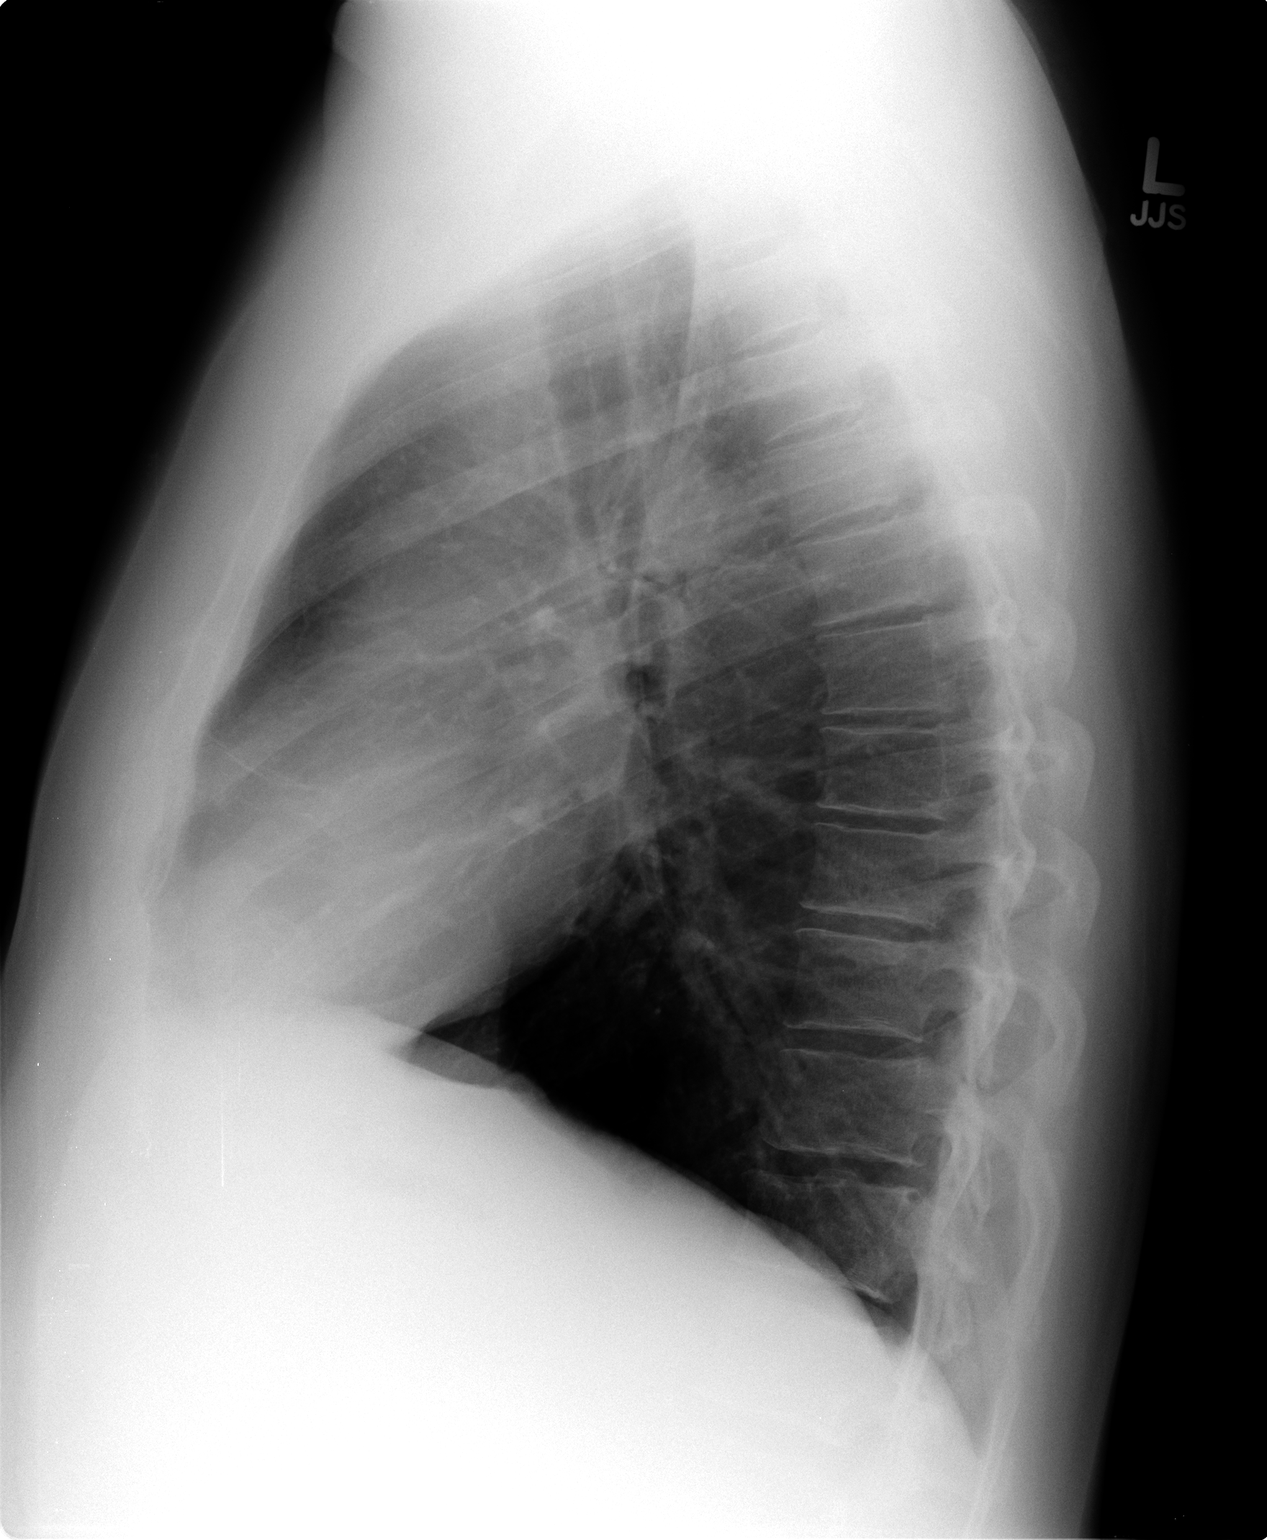

[2 of 2 positions shown; findings below may reference images not displayed]

FINDINGS: Hyperinflation of the lungs. Heart and mediastinal
contours are within normal limits.  No focal opacities or
effusions.  No acute bony abnormality.
IMPRESSION: Hyperinflation.  No acute findings.

## 2012-10-01 ENCOUNTER — Other Ambulatory Visit: Payer: Self-pay | Admitting: Internal Medicine

## 2012-10-20 ENCOUNTER — Other Ambulatory Visit: Payer: Self-pay | Admitting: Internal Medicine

## 2012-10-27 ENCOUNTER — Encounter: Payer: Self-pay | Admitting: Gastroenterology

## 2012-11-02 ENCOUNTER — Encounter: Payer: Self-pay | Admitting: Internal Medicine

## 2012-11-02 ENCOUNTER — Ambulatory Visit (INDEPENDENT_AMBULATORY_CARE_PROVIDER_SITE_OTHER): Payer: BC Managed Care – PPO | Admitting: Internal Medicine

## 2012-11-02 VITALS — BP 118/90 | HR 68 | Temp 99.1°F | Resp 16 | Wt 180.0 lb

## 2012-11-02 DIAGNOSIS — J40 Bronchitis, not specified as acute or chronic: Secondary | ICD-10-CM

## 2012-11-02 DIAGNOSIS — E538 Deficiency of other specified B group vitamins: Secondary | ICD-10-CM

## 2012-11-02 DIAGNOSIS — R21 Rash and other nonspecific skin eruption: Secondary | ICD-10-CM | POA: Insufficient documentation

## 2012-11-02 MED ORDER — VITAMIN B-12 1000 MCG SL SUBL: 1.0000 | SUBLINGUAL_TABLET | Freq: Every day | SUBLINGUAL | Status: AC

## 2012-11-02 MED ORDER — TRIAMCINOLONE ACETONIDE 0.5 % EX OINT: TOPICAL_OINTMENT | Freq: Two times a day (BID) | CUTANEOUS | Status: DC

## 2012-11-02 MED ORDER — CVS BALANCED B-100 PO TABS: ORAL_TABLET | ORAL | Status: AC

## 2012-11-02 MED ORDER — PROMETHAZINE-CODEINE 6.25-10 MG/5ML PO SYRP: 5.0000 mL | ORAL_SOLUTION | ORAL | Status: DC | PRN

## 2012-11-02 MED ORDER — AZITHROMYCIN 250 MG PO TABS: ORAL_TABLET | ORAL | Status: DC

## 2012-11-02 NOTE — Assessment & Plan Note (Signed)
Re-start Rx 

## 2012-11-02 NOTE — Assessment & Plan Note (Signed)
Triamc Shower less

## 2012-11-02 NOTE — Progress Notes (Signed)
   Subjective:    Cough This is a new problem. The current episode started in the past 7 days. The problem has been gradually worsening. The problem occurs every few minutes. The cough is productive of purulent sputum. Pertinent negatives include no chest pain, eye redness, rash, sore throat or wheezing. There is no history of asthma.       BP Readings from Last 3 Encounters:  11/02/12 118/90  06/26/12 140/80  12/16/11 108/70   Wt Readings from Last 3 Encounters:  11/02/12 180 lb (81.647 kg)  06/26/12 194 lb (87.998 kg)  12/16/11 196 lb 8 oz (89.132 kg)       .Review of Systems  Constitutional: Negative for appetite change, fatigue and unexpected weight change.  HENT: Negative for nosebleeds, congestion, sore throat, sneezing, mouth sores, trouble swallowing, neck pain, voice change and tinnitus.   Eyes: Negative for redness, itching and visual disturbance.  Respiratory: Positive for cough. Negative for wheezing.   Cardiovascular: Negative for chest pain, palpitations and leg swelling.  Gastrointestinal: Negative for nausea, diarrhea, blood in stool and abdominal distention.  Genitourinary: Negative for frequency, hematuria and difficulty urinating.  Musculoskeletal: Negative for back pain, joint swelling and gait problem.  Skin: Negative for rash.  Neurological: Negative for dizziness, tremors, speech difficulty and weakness.  Psychiatric/Behavioral: Negative for suicidal ideas, hallucinations, behavioral problems, sleep disturbance, self-injury, dysphoric mood and agitation. The patient is not nervous/anxious and is not hyperactive.        Objective:   Physical Exam  Constitutional: He is oriented to person, place, and time. He appears well-developed and well-nourished. No distress.  HENT:  Mouth/Throat: Oropharynx is clear and moist.  Eyes: Conjunctivae are normal. Pupils are equal, round, and reactive to light. Left eye exhibits no discharge.  Hard hearing  Neck:  Normal range of motion. No JVD present. No thyromegaly present.  Cardiovascular: Normal rate, regular rhythm, normal heart sounds and intact distal pulses.  Exam reveals no gallop and no friction rub.   No murmur heard. Pulmonary/Chest: Effort normal and breath sounds normal. No respiratory distress. He has no wheezes. He has no rales. He exhibits no tenderness.  Abdominal: Soft. Bowel sounds are normal. He exhibits no distension and no mass. There is no tenderness. There is no rebound and no guarding.  Musculoskeletal: Normal range of motion. He exhibits no edema and no tenderness.  Lymphadenopathy:    He has no cervical adenopathy.  Neurological: He is alert and oriented to person, place, and time. He has normal reflexes. No cranial nerve deficit. He exhibits normal muscle tone. Coordination normal.  Skin: Skin is warm and dry. No rash noted.  Psychiatric: He has a normal mood and affect. His behavior is normal. Judgment and thought content normal.   Dry rash on extremities  Lab Results  Component Value Date   WBC 7.6 06/27/2012   HGB 16.4 06/27/2012   HCT 47.5 06/27/2012   PLT 307.0 06/27/2012   GLUCOSE 97 06/27/2012   CHOL 217* 06/27/2012   TRIG 96.0 06/27/2012   HDL 57.30 06/27/2012   LDLDIRECT 140.7 06/27/2012   ALT 20 08/13/2011   AST 21 08/13/2011   NA 138 06/27/2012   K 4.4 06/27/2012   CL 99 06/27/2012   CREATININE 0.9 06/27/2012   BUN 15 06/27/2012   CO2 30 06/27/2012   TSH 1.10 06/27/2012   PSA 0.63 06/27/2012   INR 1.0 10/04/2006        Assessment & Plan:

## 2012-11-02 NOTE — Assessment & Plan Note (Signed)
Zpac Prom-cod 

## 2012-11-02 NOTE — Assessment & Plan Note (Signed)
Triamcinolone oint 

## 2012-12-08 ENCOUNTER — Encounter: Payer: Self-pay | Admitting: Gastroenterology

## 2012-12-08 ENCOUNTER — Ambulatory Visit (INDEPENDENT_AMBULATORY_CARE_PROVIDER_SITE_OTHER): Payer: BC Managed Care – PPO | Admitting: Gastroenterology

## 2012-12-08 VITALS — BP 110/74 | HR 76 | Ht 69.0 in | Wt 184.2 lb

## 2012-12-08 DIAGNOSIS — K2270 Barrett's esophagus without dysplasia: Secondary | ICD-10-CM

## 2012-12-08 NOTE — Progress Notes (Signed)
Review of pertinent gastrointestinal problems:  1. Mild to mod, h pylori neg gastritis on EGD 08/2010; likely from alcohol (10 beers a day).  2. Short segment Barret's without dysplasia, EGD 08/2100, recall at one year interval.  Was sent letter in 2013 and also 2014 asking him to schedule the repeat EGD.   HPI: This is a very pleasant 47 year old man whom I last saw about 2 years ago. We have been trying to get him back in for Barrett's surveillance the past 2 years.  No troubles with swallowing.   Lost some weight.  NO dysphagia.  Quit smoking.  Drinks only on weekends now.  Takes prevacid daily.  NO pyrosis  Past Medical History  Diagnosis Date  . Anxiety   . GERD (gastroesophageal reflux disease)   . Alcoholism   . Hypertension   . Vitamin B12 deficiency   . Cluster headaches   . Barrett's esophagus     Past Surgical History  Procedure Laterality Date  . Cochlear implant    . Other surgical history      endoscopy    Current Outpatient Prescriptions  Medication Sig Dispense Refill  . ALPRAZolam (XANAX) 0.5 MG tablet Take 0.5-1 tablets (0.25-0.5 mg total) by mouth daily as needed. For anxiety  60 tablet  2  . AZOR 10-40 MG per tablet TAKE 1 TABLET EVERY DAY FOR BLOOD PRESSURE  90 tablet  2  . Cholecalciferol (VITAMIN D3) 1000 UNITS CAPS Take 2 tablets by mouth daily.       . Cyanocobalamin (VITAMIN B-12) 1000 MCG SUBL Place 1 tablet (1,000 mcg total) under the tongue daily.  100 tablet  3  . fluvoxaMINE (LUVOX) 100 MG tablet Take 1 tablet (100 mg total) by mouth 2 (two) times daily.  180 tablet  2  . lansoprazole (PREVACID) 30 MG capsule Take 1 capsule (30 mg total) by mouth 2 (two) times daily.  180 capsule  3  . triamcinolone ointment (KENALOG) 0.5 % Apply topically 2 (two) times daily.  30 g  2  . Vitamins-Lipotropics ([employer] BALANCED B-100) TABS TAKE 1 TABLET BY MOUTH DAILY.  100 tablet  3   Current Facility-Administered Medications  Medication Dose Route Frequency  Provider Last Rate Last Dose  . methylPREDNISolone acetate (DEPO-MEDROL) injection 20 mg  20 mg Intra-Lesional Once Tresa Garter, MD        Allergies as of 12/08/2012  . (No Known Allergies)    Family History  Problem Relation Age of Onset  . Hypertension    . Colon cancer Neg Hx   . Heart disease Father 69    MI, CAD  . Stroke Mother     History   Social History  . Marital Status: Legally Separated    Spouse Name: N/A    Number of Children: 1  . Years of Education: N/A   Occupational History  . [employer]    Social History Main Topics  . Smoking status: Former Smoker -- 0.30 packs/day    Types: Cigarettes  . Smokeless tobacco: Former Neurosurgeon    Quit date: 11/26/2011  . Alcohol Use: Yes     Comment: hx of alcoholism  . Drug Use: No  . Sexual Activity: Yes   Other Topics Concern  . Not on file   Social History Narrative  . No narrative on file      Physical Exam: BP 110/74  Pulse 76  Ht 5\' 9"  (1.753 m)  Wt 184 lb 3.2 oz (83.553 kg)  BMI 27.19 kg/m2 Constitutional: generally well-appearing Psychiatric: alert and oriented x3 Abdomen: soft, nontender, nondistended, no obvious ascites, no peritoneal signs, normal bowel sounds     Assessment and plan: 47 y.o. male with history of Barrett's, GERD without alarm symptoms  He needs Barrett's surveillance upper endoscopy at his soonest convenience. If there is no dysplasia on that test then he would probably be safe to spread out to 3-4 years before his next surveillance. He has no alarm symptoms and my suspicion for cancer is low.

## 2012-12-08 NOTE — Patient Instructions (Addendum)
You will be set up for an upper endoscopy (MAC sedation, LEC) for Barrett's surveillance.

## 2012-12-11 ENCOUNTER — Encounter: Payer: Self-pay | Admitting: Gastroenterology

## 2013-01-05 ENCOUNTER — Other Ambulatory Visit: Payer: Self-pay | Admitting: Otolaryngology

## 2013-01-05 DIAGNOSIS — H9319 Tinnitus, unspecified ear: Secondary | ICD-10-CM

## 2013-01-05 DIAGNOSIS — H903 Sensorineural hearing loss, bilateral: Secondary | ICD-10-CM

## 2013-01-05 DIAGNOSIS — R42 Dizziness and giddiness: Secondary | ICD-10-CM

## 2013-01-09 ENCOUNTER — Ambulatory Visit
Admission: RE | Admit: 2013-01-09 | Discharge: 2013-01-09 | Disposition: A | Discharge status: Home / Self Care | Payer: BC Managed Care – PPO | Source: Ambulatory Visit | Attending: Otolaryngology | Admitting: Otolaryngology

## 2013-01-09 DIAGNOSIS — H903 Sensorineural hearing loss, bilateral: Secondary | ICD-10-CM

## 2013-01-09 DIAGNOSIS — H9319 Tinnitus, unspecified ear: Secondary | ICD-10-CM

## 2013-01-09 DIAGNOSIS — R42 Dizziness and giddiness: Secondary | ICD-10-CM

## 2013-01-09 MED ORDER — IOHEXOL 300 MG/ML  SOLN
75.0000 mL | Freq: Once | INTRAMUSCULAR | Status: AC | PRN
  Administered 2013-01-09: 75 mL via INTRAVENOUS

## 2013-01-19 ENCOUNTER — Encounter: Payer: BC Managed Care – PPO | Admitting: Gastroenterology

## 2013-02-02 ENCOUNTER — Ambulatory Visit (INDEPENDENT_AMBULATORY_CARE_PROVIDER_SITE_OTHER): Payer: BC Managed Care – PPO | Admitting: Internal Medicine

## 2013-02-02 ENCOUNTER — Encounter: Payer: Self-pay | Admitting: Internal Medicine

## 2013-02-02 VITALS — BP 130/82 | HR 76 | Temp 98.4°F | Resp 16 | Wt 188.0 lb

## 2013-02-02 DIAGNOSIS — J40 Bronchitis, not specified as acute or chronic: Secondary | ICD-10-CM

## 2013-02-02 MED ORDER — BENZONATATE 200 MG PO CAPS: 200.0000 mg | ORAL_CAPSULE | Freq: Two times a day (BID) | ORAL | Status: DC | PRN

## 2013-02-02 MED ORDER — TRIAMCINOLONE ACETONIDE 0.5 % EX OINT: TOPICAL_OINTMENT | Freq: Two times a day (BID) | CUTANEOUS | Status: DC

## 2013-02-02 MED ORDER — CEFUROXIME AXETIL 500 MG PO TABS: ORAL_TABLET | ORAL | Status: DC

## 2013-02-02 NOTE — Patient Instructions (Signed)
Use over-the-counter  "cold" medicines  such as  "Afrin" nasal spray for nasal congestion as directed instead. Use" Delsym" or" Robitussin" cough syrup varietis for cough.  You can use plain "Tylenol" or "Advil" for fever, chills and achyness.  Please, make an appointment if you are not better or if you're worse.  

## 2013-02-02 NOTE — Progress Notes (Signed)
   Subjective:    Cough This is a new problem. The current episode started in the past 7 days. The problem has been gradually worsening. The problem occurs every few minutes. The cough is productive of purulent sputum. Pertinent negatives include no chest pain, eye redness, rash, sore throat or wheezing. There is no history of asthma.       BP Readings from Last 3 Encounters:  02/02/13 130/82  12/08/12 110/74  11/02/12 118/90   Wt Readings from Last 3 Encounters:  02/02/13 188 lb (85.276 kg)  12/08/12 184 lb 3.2 oz (83.553 kg)  11/02/12 180 lb (81.647 kg)       .Review of Systems  Constitutional: Negative for appetite change, fatigue and unexpected weight change.  HENT: Negative for congestion, mouth sores, nosebleeds, sneezing, sore throat, tinnitus, trouble swallowing and voice change.   Eyes: Negative for redness, itching and visual disturbance.  Respiratory: Positive for cough. Negative for wheezing.   Cardiovascular: Negative for chest pain, palpitations and leg swelling.  Gastrointestinal: Negative for nausea, diarrhea, blood in stool and abdominal distention.  Genitourinary: Negative for frequency, hematuria and difficulty urinating.  Musculoskeletal: Negative for back pain, gait problem, joint swelling and neck pain.  Skin: Negative for rash.  Neurological: Negative for dizziness, tremors, speech difficulty and weakness.  Psychiatric/Behavioral: Negative for suicidal ideas, hallucinations, behavioral problems, sleep disturbance, self-injury, dysphoric mood and agitation. The patient is not nervous/anxious and is not hyperactive.        Objective:   Physical Exam  Constitutional: He is oriented to person, place, and time. He appears well-developed and well-nourished. No distress.  HENT:  Mouth/Throat: Oropharynx is clear and moist.  Eyes: Conjunctivae are normal. Pupils are equal, round, and reactive to light. Left eye exhibits no discharge.  Hard hearing  Neck:  Normal range of motion. No JVD present. No thyromegaly present.  Cardiovascular: Normal rate, regular rhythm, normal heart sounds and intact distal pulses.  Exam reveals no gallop and no friction rub.   No murmur heard. Pulmonary/Chest: Effort normal and breath sounds normal. No respiratory distress. He has no wheezes. He has no rales. He exhibits no tenderness.  Abdominal: Soft. Bowel sounds are normal. He exhibits no distension and no mass. There is no tenderness. There is no rebound and no guarding.  Musculoskeletal: Normal range of motion. He exhibits no edema and no tenderness.  Lymphadenopathy:    He has no cervical adenopathy.  Neurological: He is alert and oriented to person, place, and time. He has normal reflexes. No cranial nerve deficit. He exhibits normal muscle tone. Coordination normal.  Skin: Skin is warm and dry. No rash noted.  Psychiatric: He has a normal mood and affect. His behavior is normal. Judgment and thought content normal.   Dry rash on extremities  Lab Results  Component Value Date   WBC 7.6 06/27/2012   HGB 16.4 06/27/2012   HCT 47.5 06/27/2012   PLT 307.0 06/27/2012   GLUCOSE 97 06/27/2012   CHOL 217* 06/27/2012   TRIG 96.0 06/27/2012   HDL 57.30 06/27/2012   LDLDIRECT 140.7 06/27/2012   ALT 20 08/13/2011   AST 21 08/13/2011   NA 138 06/27/2012   K 4.4 06/27/2012   CL 99 06/27/2012   CREATININE 0.9 06/27/2012   BUN 15 06/27/2012   CO2 30 06/27/2012   TSH 1.10 06/27/2012   PSA 0.63 06/27/2012   INR 1.0 10/04/2006        Assessment & Plan:

## 2013-02-04 NOTE — Assessment & Plan Note (Signed)
Ceftin x10 d 

## 2013-02-15 ENCOUNTER — Encounter (HOSPITAL_COMMUNITY): Payer: Self-pay | Admitting: Emergency Medicine

## 2013-02-15 ENCOUNTER — Emergency Department (HOSPITAL_COMMUNITY)
Admission: EM | Admit: 2013-02-15 | Discharge: 2013-02-15 | Disposition: A | Discharge status: Home / Self Care | Payer: BC Managed Care – PPO | Attending: Emergency Medicine | Admitting: Emergency Medicine

## 2013-02-15 DIAGNOSIS — K219 Gastro-esophageal reflux disease without esophagitis: Secondary | ICD-10-CM | POA: Insufficient documentation

## 2013-02-15 DIAGNOSIS — R42 Dizziness and giddiness: Secondary | ICD-10-CM | POA: Insufficient documentation

## 2013-02-15 DIAGNOSIS — R5381 Other malaise: Secondary | ICD-10-CM | POA: Insufficient documentation

## 2013-02-15 DIAGNOSIS — R209 Unspecified disturbances of skin sensation: Secondary | ICD-10-CM | POA: Insufficient documentation

## 2013-02-15 DIAGNOSIS — Z87891 Personal history of nicotine dependence: Secondary | ICD-10-CM | POA: Insufficient documentation

## 2013-02-15 DIAGNOSIS — I10 Essential (primary) hypertension: Secondary | ICD-10-CM | POA: Insufficient documentation

## 2013-02-15 DIAGNOSIS — Z8669 Personal history of other diseases of the nervous system and sense organs: Secondary | ICD-10-CM | POA: Insufficient documentation

## 2013-02-15 DIAGNOSIS — Z79899 Other long term (current) drug therapy: Secondary | ICD-10-CM | POA: Insufficient documentation

## 2013-02-15 DIAGNOSIS — R6883 Chills (without fever): Secondary | ICD-10-CM | POA: Insufficient documentation

## 2013-02-15 DIAGNOSIS — E538 Deficiency of other specified B group vitamins: Secondary | ICD-10-CM | POA: Insufficient documentation

## 2013-02-15 DIAGNOSIS — R002 Palpitations: Secondary | ICD-10-CM | POA: Insufficient documentation

## 2013-02-15 DIAGNOSIS — F1021 Alcohol dependence, in remission: Secondary | ICD-10-CM | POA: Insufficient documentation

## 2013-02-15 DIAGNOSIS — F410 Panic disorder [episodic paroxysmal anxiety] without agoraphobia: Secondary | ICD-10-CM

## 2013-02-15 DIAGNOSIS — R0602 Shortness of breath: Secondary | ICD-10-CM | POA: Insufficient documentation

## 2013-02-15 DIAGNOSIS — IMO0002 Reserved for concepts with insufficient information to code with codable children: Secondary | ICD-10-CM | POA: Insufficient documentation

## 2013-02-15 NOTE — ED Provider Notes (Signed)
CSN: [id number]     Arrival date & time 02/15/13  7829 History   First MD Initiated Contact with Patient 02/15/13 [phone]     Chief Complaint  Patient presents with  . Panic Attack   (Consider location/radiation/quality/duration/timing/severity/associated sxs/prior Treatment) HPI Pt is a 47yo male with hx of anxiety and cochlear implant BIB EMS for panic attack that started earlier this morning while pt was eating breakfast and watching television. Pt reports feeling his heart race, felt chills, and lightheaded all of a sudden.  He did take xanax which initially helped with symptoms and he called a family friend who was able to talk to him and help calm him down with breathing, however reports symptoms started to come back so he called EMS.  Pt states on the way to the hospital pt did start to feel better but would like to be checked out.  Pt reports feeling well recently. Last illness was around the end of October but states he did improve since then.  Family friend reports pt has had recent adjustments to his coclear implant for which he has had for 6 years.  Pt states it use to be at 20% but now up to about 70% and noticed he is more concerned about surrounding noises.  Otherwise, pt cannot think of any recent stressful events.   Past Medical History  Diagnosis Date  . Anxiety   . GERD (gastroesophageal reflux disease)   . Alcoholism   . Hypertension   . Vitamin B12 deficiency   . Cluster headaches   . Barrett's esophagus    Past Surgical History  Procedure Laterality Date  . Cochlear implant    . Other surgical history      endoscopy   Family History  Problem Relation Age of Onset  . Hypertension    . Colon cancer Neg Hx   . Heart disease Father 49    MI, CAD  . Stroke Mother    History  Substance Use Topics  . Smoking status: Former Smoker -- 0.30 packs/day    Types: Cigarettes  . Smokeless tobacco: Former Neurosurgeon    Quit date: 11/26/2011  . Alcohol Use: Yes     Comment: hx  of alcoholism    Review of Systems  Constitutional: Positive for chills. Negative for fever and diaphoresis.  Respiratory: Positive for shortness of breath. Negative for cough.   Cardiovascular: Positive for palpitations. Negative for chest pain.  Gastrointestinal: Negative for nausea and vomiting.  Neurological: Positive for weakness, light-headedness and numbness. Negative for dizziness, tremors, seizures, syncope, facial asymmetry, speech difficulty and headaches.  All other systems reviewed and are negative.    Allergies  Review of patient's allergies indicates no known allergies.  Home Medications   Current Outpatient Rx  Name  Route  Sig  Dispense  Refill  . ALPRAZolam (XANAX) 0.5 MG tablet   Oral   Take 0.5-1 tablets (0.25-0.5 mg total) by mouth daily as needed. For anxiety   60 tablet   2   . AZOR 10-40 MG per tablet      TAKE 1 TABLET EVERY DAY FOR BLOOD PRESSURE   90 tablet   2   . benzonatate (TESSALON) 200 MG capsule   Oral   Take 1 capsule (200 mg total) by mouth 2 (two) times daily as needed for cough.   20 capsule   1   . cefUROXime (CEFTIN) 500 MG tablet   Oral   Take 500 mg by mouth 2 (two)  times daily with a meal. For 10 days. Started on 02-02-13.  Pt's on day 5 of therapy         . Cholecalciferol (VITAMIN D3) 1000 UNITS CAPS   Oral   Take 2 tablets by mouth daily.          . Cyanocobalamin (VITAMIN B-12) 1000 MCG SUBL   Sublingual   Place 1 tablet (1,000 mcg total) under the tongue daily.   100 tablet   3   . fluvoxaMINE (LUVOX) 100 MG tablet   Oral   Take 1 tablet (100 mg total) by mouth 2 (two) times daily.   180 tablet   2   . lansoprazole (PREVACID) 30 MG capsule   Oral   Take 1 capsule (30 mg total) by mouth 2 (two) times daily.   180 capsule   3     Pt must have office visit for further refills   . triamcinolone ointment (KENALOG) 0.5 %   Topical   Apply topically 2 (two) times daily.   30 g   2   .  Vitamins-Lipotropics ([employer] BALANCED B-100) TABS      TAKE 1 TABLET BY MOUTH DAILY.   100 tablet   3    BP 128/78  Pulse 70  Temp(Src) 97.5 F (36.4 C) (Oral)  Resp 18  SpO2 97% Physical Exam  Nursing note and vitals reviewed. Constitutional: He appears well-developed and well-nourished.  Pt lying comfortably in exam bed, NAD.   HENT:  Head: Normocephalic and atraumatic.  Cochlear implant device in place over right ear.  Eyes: Conjunctivae are normal. No scleral icterus.  Neck: Normal range of motion.  Cardiovascular: Normal rate, regular rhythm and normal heart sounds.   Pulmonary/Chest: Effort normal and breath sounds normal. No respiratory distress. He has no wheezes. He has no rales. He exhibits no tenderness.  Abdominal: Soft. Bowel sounds are normal. He exhibits no distension and no mass. There is no tenderness. There is no rebound and no guarding.  Musculoskeletal: Normal range of motion.  Neurological: He is alert.  Skin: Skin is warm and dry.  Psychiatric: He has a normal mood and affect. His behavior is normal.    ED Course  Procedures (including critical care time) Labs Review Labs Reviewed - No data to display Imaging Review No results found.  EKG Interpretation   None       MDM   1. Panic attack    Pt presenting after panic attack earlier this morning. Pt reports symptoms were consistent with previous attacks. Denies symptoms upon arrival to ED. Denies chest pain during or after panic attack.  Denies SOB at this time. No complaints at this time.  Pt does admit to being extra vigilant due to recent adjustment of cochlear implant from 20% to about 70%.  Family friend also suggests new sounds could be causing pt's recent anxiety.  Vitals: unremarkable. No hypoxia.  Pt appears well and alert.  On exam, normal neuro exam. No neuro deficits or ataxia. Normal gait.  Not concerned for emergent process taking place at this time. Do not believe further workup is  needed at this time. All questions answered and concerns addressed. Will discharge pt home and have pt f/u with his PCP, Dr. Posey Rea.  Pt states he believes he can get an appointment with him tomorrow. Return precautions given. Pt verbalized understanding and agreement with tx plan. Vitals: unremarkable. Discharged in stable condition.         Junius Finner,  PA-C 02/15/13 1100

## 2013-02-15 NOTE — ED Notes (Signed)
Pt arrives via EMs from work with panic attack. States took one xanax upon calling EMS. In transport pt began feeling better. Just here to get check out. Pt alert, oriented x4, calm, NAD at present. States still lightheaded sensation.

## 2013-02-15 NOTE — ED Notes (Signed)
Pt. Was feeling much better, Took Xanax prior to coming to Ed.  Pt. Reports that panic attack has subsided.

## 2013-02-16 ENCOUNTER — Ambulatory Visit: Payer: BC Managed Care – PPO | Admitting: Internal Medicine

## 2013-02-16 NOTE — ED Provider Notes (Signed)
Medical screening examination/treatment/procedure(s) were performed by non-physician practitioner and as supervising physician I was immediately available for consultation/collaboration.  EKG Interpretation   None         Shanna Cisco, MD 02/16/13 1801

## 2013-02-27 ENCOUNTER — Encounter: Payer: Self-pay | Admitting: Internal Medicine

## 2013-02-27 ENCOUNTER — Ambulatory Visit (INDEPENDENT_AMBULATORY_CARE_PROVIDER_SITE_OTHER): Payer: BC Managed Care – PPO | Admitting: Internal Medicine

## 2013-02-27 VITALS — BP 124/88 | HR 80 | Temp 98.6°F | Resp 16 | Wt 191.0 lb

## 2013-02-27 DIAGNOSIS — E538 Deficiency of other specified B group vitamins: Secondary | ICD-10-CM

## 2013-02-27 DIAGNOSIS — F410 Panic disorder [episodic paroxysmal anxiety] without agoraphobia: Secondary | ICD-10-CM

## 2013-02-27 DIAGNOSIS — J40 Bronchitis, not specified as acute or chronic: Secondary | ICD-10-CM

## 2013-02-27 DIAGNOSIS — R0600 Dyspnea, unspecified: Secondary | ICD-10-CM

## 2013-02-27 DIAGNOSIS — Z8249 Family history of ischemic heart disease and other diseases of the circulatory system: Secondary | ICD-10-CM

## 2013-02-27 DIAGNOSIS — R0609 Other forms of dyspnea: Secondary | ICD-10-CM

## 2013-02-27 MED ORDER — UMECLIDINIUM-VILANTEROL 62.5-25 MCG/INH IN AEPB: 1.0000 | INHALATION_SPRAY | Freq: Every day | RESPIRATORY_TRACT | Status: DC

## 2013-02-27 MED ORDER — ASPIRIN 81 MG PO CHEW: 81.0000 mg | CHEWABLE_TABLET | Freq: Every day | ORAL | Status: DC

## 2013-02-27 MED ORDER — AZITHROMYCIN 250 MG PO TABS: ORAL_TABLET | ORAL | Status: DC

## 2013-02-27 NOTE — Assessment & Plan Note (Signed)
Continue with current prescription therapy as reflected on the Med list.  

## 2013-02-27 NOTE — Progress Notes (Signed)
Subjective:   C/o URI, SOB at times  Cough This is a new problem. The current episode started in the past 7 days. The problem has been gradually worsening. The problem occurs every few minutes. The cough is productive of purulent sputum. Pertinent negatives include no chest pain, eye redness, rash, sore throat or wheezing. There is no history of asthma.       BP Readings from Last 3 Encounters:  02/27/13 124/88  02/15/13 128/78  02/02/13 130/82   Wt Readings from Last 3 Encounters:  02/27/13 191 lb (86.637 kg)  02/02/13 188 lb (85.276 kg)  12/08/12 184 lb 3.2 oz (83.553 kg)       .Review of Systems  Constitutional: Negative for appetite change, fatigue and unexpected weight change.  HENT: Negative for congestion, mouth sores, nosebleeds, sneezing, sore throat, tinnitus, trouble swallowing and voice change.   Eyes: Negative for redness, itching and visual disturbance.  Respiratory: Positive for cough. Negative for wheezing.   Cardiovascular: Negative for chest pain, palpitations and leg swelling.  Gastrointestinal: Negative for nausea, diarrhea, blood in stool and abdominal distention.  Genitourinary: Negative for frequency, hematuria and difficulty urinating.  Musculoskeletal: Negative for back pain, gait problem, joint swelling and neck pain.  Skin: Negative for rash.  Neurological: Negative for dizziness, tremors, speech difficulty and weakness.  Psychiatric/Behavioral: Negative for suicidal ideas, hallucinations, behavioral problems, sleep disturbance, self-injury, dysphoric mood and agitation. The patient is not nervous/anxious and is not hyperactive.        Objective:   Physical Exam  Constitutional: He is oriented to person, place, and time. He appears well-developed and well-nourished. No distress.  HENT:  Mouth/Throat: Oropharynx is clear and moist.  Eyes: Conjunctivae are normal. Pupils are equal, round, and reactive to light. Left eye exhibits no discharge.   Hard hearing  Neck: Normal range of motion. No JVD present. No thyromegaly present.  Cardiovascular: Normal rate, regular rhythm, normal heart sounds and intact distal pulses.  Exam reveals no gallop and no friction rub.   No murmur heard. Pulmonary/Chest: Effort normal and breath sounds normal. No respiratory distress. He has no wheezes. He has no rales. He exhibits no tenderness.  Abdominal: Soft. Bowel sounds are normal. He exhibits no distension and no mass. There is no tenderness. There is no rebound and no guarding.  Musculoskeletal: Normal range of motion. He exhibits no edema and no tenderness.  Lymphadenopathy:    He has no cervical adenopathy.  Neurological: He is alert and oriented to person, place, and time. He has normal reflexes. No cranial nerve deficit. He exhibits normal muscle tone. Coordination normal.  Skin: Skin is warm and dry. No rash noted.  Psychiatric: He has a normal mood and affect. His behavior is normal. Judgment and thought content normal.  eryth throat  I personally provided Anoro inhaler use teaching. After the teaching patient was able to demonstrate it's use effectively. All questions were answered   Lab Results  Component Value Date   WBC 7.6 06/27/2012   HGB 16.4 06/27/2012   HCT 47.5 06/27/2012   PLT 307.0 06/27/2012   GLUCOSE 97 06/27/2012   CHOL 217* 06/27/2012   TRIG 96.0 06/27/2012   HDL 57.30 06/27/2012   LDLDIRECT 140.7 06/27/2012   ALT 20 08/13/2011   AST 21 08/13/2011   NA 138 06/27/2012   K 4.4 06/27/2012   CL 99 06/27/2012   CREATININE 0.9 06/27/2012   BUN 15 06/27/2012   CO2 30 06/27/2012   TSH 1.10 06/27/2012  PSA 0.63 06/27/2012   INR 1.0 10/04/2006        Assessment & Plan:

## 2013-02-27 NOTE — Assessment & Plan Note (Addendum)
Discussed  ASA 81 mg/d Cardiol ref

## 2013-02-27 NOTE — Assessment & Plan Note (Addendum)
Start abx Zpac

## 2013-03-09 ENCOUNTER — Encounter: Payer: Self-pay | Admitting: Internal Medicine

## 2013-03-09 ENCOUNTER — Ambulatory Visit (INDEPENDENT_AMBULATORY_CARE_PROVIDER_SITE_OTHER): Payer: BC Managed Care – PPO | Admitting: Internal Medicine

## 2013-03-09 VITALS — BP 118/80 | HR 87 | Ht 69.0 in | Wt 186.0 lb

## 2013-03-09 DIAGNOSIS — R0789 Other chest pain: Secondary | ICD-10-CM

## 2013-03-09 DIAGNOSIS — I10 Essential (primary) hypertension: Secondary | ICD-10-CM

## 2013-03-09 NOTE — Patient Instructions (Signed)
Your physician has requested that you have a carotid duplex. This test is an ultrasound of the carotid arteries in your neck. It looks at blood flow through these arteries that supply the brain with blood. Allow one hour for this exam. There are no restrictions or special instructions.  Your physician has requested that you have en exercise stress myoview. For further information please visit [email or web address] Please follow instruction sheet, as given.  Follow to be determined after teasting.

## 2013-03-09 NOTE — Progress Notes (Signed)
HPI Patient is a 47 yo with no known CAD Seen in ER in November.  Felt heart race, lightheaded.  Became SOB   Treated in October for bronchitis bu feels he has not gotten better  Still SOB with activity  IN ER told he had a panic attack  Rx with Xanax  He is still concerned.  Has strong FHx of CAD  No Known Allergies  Current Outpatient Prescriptions  Medication Sig Dispense Refill  . ALPRAZolam (XANAX) 0.5 MG tablet Take 0.5-1 tablets (0.25-0.5 mg total) by mouth daily as needed. For anxiety  60 tablet  2  . aspirin (ASPIRIN CHILDRENS) 81 MG chewable tablet Chew 1 tablet (81 mg total) by mouth daily.  100 tablet  11  . azithromycin (ZITHROMAX) 250 MG tablet As directed  6 tablet  0  . AZOR 10-40 MG per tablet TAKE 1 TABLET EVERY DAY FOR BLOOD PRESSURE  90 tablet  2  . Cholecalciferol (VITAMIN D3) 1000 UNITS CAPS Take 2 tablets by mouth daily.       . Cyanocobalamin (VITAMIN B-12) 1000 MCG SUBL Place 1 tablet (1,000 mcg total) under the tongue daily.  100 tablet  3  . fluvoxaMINE (LUVOX) 100 MG tablet Take 1 tablet (100 mg total) by mouth 2 (two) times daily.  180 tablet  2  . lansoprazole (PREVACID) 30 MG capsule Take 1 capsule (30 mg total) by mouth 2 (two) times daily.  180 capsule  3  . triamcinolone ointment (KENALOG) 0.5 % Apply topically 2 (two) times daily.  30 g  2  . Umeclidinium-Vilanterol (ANORO ELLIPTA) 62.5-25 MCG/INH AEPB Inhale 1 Act into the lungs daily.  1 each  5  . Vitamins-Lipotropics ([employer] BALANCED B-100) TABS TAKE 1 TABLET BY MOUTH DAILY.  100 tablet  3   Current Facility-Administered Medications  Medication Dose Route Frequency Provider Last Rate Last Dose  . methylPREDNISolone acetate (DEPO-MEDROL) injection 20 mg  20 mg Intra-Lesional Once Tresa Garter, MD        Past Medical History  Diagnosis Date  . Anxiety   . GERD (gastroesophageal reflux disease)   . Alcoholism   . Hypertension   . Vitamin B12 deficiency   . Cluster headaches   . Barrett's  esophagus     Past Surgical History  Procedure Laterality Date  . Cochlear implant    . Other surgical history      endoscopy    Family History  Problem Relation Age of Onset  . Hypertension    . Colon cancer Neg Hx   . Heart disease Father 52    MI, CAD  . Stroke Mother     History   Social History  . Marital Status: Legally Separated    Spouse Name: N/A    Number of Children: 1  . Years of Education: N/A   Occupational History  . [employer]    Social History Main Topics  . Smoking status: Former Smoker -- 0.30 packs/day    Types: Cigarettes  . Smokeless tobacco: Former Neurosurgeon    Quit date: 11/26/2011  . Alcohol Use: Yes     Comment: hx of alcoholism  . Drug Use: No  . Sexual Activity: Yes   Other Topics Concern  . Not on file   Social History Narrative  . No narrative on file    Review of Systems:  All systems reviewed.  They are negative to the above problem except as previously stated.  Vital Signs: BP 118/80  Pulse  87  Ht 5\' 9"  (1.753 m)  Wt 186 lb (84.369 kg)  BMI 27.45 kg/m2  Physical Exam Patient is in NAD HEENT:  Normocephalic, atraumatic. EOMI, PERRLA.  Neck: JVP is normal.   Question bruit on R carotid.  Lungs: clear to auscultation. No rales no wheezes.  Heart: Regular rate and rhythm. Normal S1, S2. No S3.   No significant murmurs. PMI not displaced.  Abdomen:  Supple, nontender. Normal bowel sounds. No masses. No hepatomegaly.  Extremities:   Good distal pulses throughout. No lower extremity edema.  Musculoskeletal :moving all extremities.  Neuro:   alert and oriented x3.  CN II-XII grossly intact.  EKG  SR 87  Nonspecific ST T wave changes Assessment and Plan:  1.  SOB  I am not sure what this represents.  QUestion anxiety. I would recomm esp with family history settting the patient up for a stree myoview.  2.  Bruit  WOuld set up for carotid USN

## 2013-03-19 ENCOUNTER — Encounter: Payer: Self-pay | Admitting: Internal Medicine

## 2013-03-26 ENCOUNTER — Encounter (HOSPITAL_COMMUNITY): Payer: BC Managed Care – PPO

## 2013-04-13 ENCOUNTER — Other Ambulatory Visit: Payer: Self-pay | Admitting: Internal Medicine

## 2013-05-11 ENCOUNTER — Ambulatory Visit (HOSPITAL_COMMUNITY): Payer: BC Managed Care – PPO | Attending: Internal Medicine

## 2013-05-11 DIAGNOSIS — F17200 Nicotine dependence, unspecified, uncomplicated: Secondary | ICD-10-CM | POA: Insufficient documentation

## 2013-05-11 DIAGNOSIS — I10 Essential (primary) hypertension: Secondary | ICD-10-CM

## 2013-05-11 DIAGNOSIS — I658 Occlusion and stenosis of other precerebral arteries: Secondary | ICD-10-CM | POA: Insufficient documentation

## 2013-05-11 DIAGNOSIS — I6529 Occlusion and stenosis of unspecified carotid artery: Secondary | ICD-10-CM

## 2013-05-11 DIAGNOSIS — R42 Dizziness and giddiness: Secondary | ICD-10-CM | POA: Insufficient documentation

## 2013-05-11 DIAGNOSIS — R0989 Other specified symptoms and signs involving the circulatory and respiratory systems: Secondary | ICD-10-CM

## 2013-05-11 DIAGNOSIS — R0789 Other chest pain: Secondary | ICD-10-CM

## 2013-05-14 ENCOUNTER — Encounter: Payer: Self-pay | Admitting: Cardiology

## 2013-05-14 ENCOUNTER — Ambulatory Visit (HOSPITAL_COMMUNITY): Payer: BC Managed Care – PPO | Attending: Cardiology | Admitting: Radiology

## 2013-05-14 VITALS — BP 140/93 | HR 80 | Ht 69.0 in | Wt 186.0 lb

## 2013-05-14 DIAGNOSIS — R0989 Other specified symptoms and signs involving the circulatory and respiratory systems: Secondary | ICD-10-CM | POA: Insufficient documentation

## 2013-05-14 DIAGNOSIS — Z87891 Personal history of nicotine dependence: Secondary | ICD-10-CM | POA: Insufficient documentation

## 2013-05-14 DIAGNOSIS — R002 Palpitations: Secondary | ICD-10-CM | POA: Insufficient documentation

## 2013-05-14 DIAGNOSIS — I10 Essential (primary) hypertension: Secondary | ICD-10-CM | POA: Insufficient documentation

## 2013-05-14 DIAGNOSIS — R079 Chest pain, unspecified: Secondary | ICD-10-CM | POA: Insufficient documentation

## 2013-05-14 DIAGNOSIS — R0602 Shortness of breath: Secondary | ICD-10-CM

## 2013-05-14 DIAGNOSIS — R42 Dizziness and giddiness: Secondary | ICD-10-CM | POA: Insufficient documentation

## 2013-05-14 DIAGNOSIS — R0609 Other forms of dyspnea: Secondary | ICD-10-CM | POA: Insufficient documentation

## 2013-05-14 DIAGNOSIS — Z8249 Family history of ischemic heart disease and other diseases of the circulatory system: Secondary | ICD-10-CM | POA: Insufficient documentation

## 2013-05-14 DIAGNOSIS — R0789 Other chest pain: Secondary | ICD-10-CM

## 2013-05-14 MED ORDER — TECHNETIUM TC 99M SESTAMIBI GENERIC - CARDIOLITE
30.0000 | Freq: Once | INTRAVENOUS | Status: AC | PRN
  Administered 2013-05-14: 30 via INTRAVENOUS

## 2013-05-14 MED ORDER — TECHNETIUM TC 99M SESTAMIBI GENERIC - CARDIOLITE
10.0000 | Freq: Once | INTRAVENOUS | Status: AC | PRN
  Administered 2013-05-14: 10 via INTRAVENOUS

## 2013-05-14 NOTE — Progress Notes (Signed)
[facility] [address] [place], [place] [address] [phone]    Cardiology Nuclear Gray Study  Howard MaineStephen W Gray is a 48 y.o. male     MRN : [id number]     DOB: 10/25/1965  Procedure Date: 05/14/2013  Nuclear Gray Background Indication for Stress Test:  Evaluation for Ischemia History:  No known CAD, MPI 2008 (normal) EF 63% Cardiac Risk Factors: Family History - CAD, History of Smoking and Hypertension  Symptoms:  Chest Pain (last date of chest discomfort was one week ago), Dizziness, DOE, Palpitations and SOB   Nuclear Pre-Procedure Caffeine/Decaff Intake:  None NPO After: 7:00pm   Lungs:  clear O2 Sat: 98% on room air. IV 0.9% NS with Angio Cath:  20g  IV Site: R Hand  IV Started by:  Cathlyn Parsonsynthia Hasspacher, RN  Chest Size (in):  42 Cup Size: n/a  Height: 5\' 9"  (1.753 m)  Weight:  186 lb (84.369 kg)  BMI:  Body mass index is 27.45 kg/(m^2). Tech Comments:  Patient vasovagal with IV start,pale,nausea and lightheaded.BP 92/50 and patient reclined. After 15 mins patient BP 102/58. BP 102/58 and patient upright.    Nuclear Gray Study 1 or 2 day study: 1 day  Stress Test Type:  Stress  Reading MD: n/a  Order Authorizing Provider:  Gunnar FusiPaula Ross,MD  Resting Radionuclide: Technetium 5853m Sestamibi  Resting Radionuclide Dose: 11.0 mCi   Stress Radionuclide:  Technetium 2253m Sestamibi  Stress Radionuclide Dose: 33.0 mCi           Stress Protocol Rest HR: 80 Stress HR: 155  Rest BP: 140/93 Stress BP: 124/63  Exercise Time (min): 8:00 METS: 10.1   Predicted Max HR: 174 bpm % Max HR: 89.08 bpm Rate Pressure Product: [id number]   Dose of Adenosine (mg):  n/a Dose of Lexiscan: n/a mg  Dose of Atropine (mg): n/a Dose of Dobutamine: n/a mcg/kg/min (at max HR)  Stress Test Technologist: Nelson ChimesSharon Brooks, BS-ES  Nuclear Technologist:  Doyne Keelonya Yount, CNMT     Rest Procedure:  Myocardial perfusion imaging was performed at rest 45 minutes following the intravenous  administration of Technetium 1953m Sestamibi. Rest ECG: NSR - Normal EKG  Stress Procedure:  The patient exercised on the treadmill utilizing the Bruce Protocol for 8:00 minutes. The patient stopped due to fatigue and denied any chest pain.  Technetium 6853m Sestamibi was injected at peak exercise and myocardial perfusion imaging was performed after a brief delay. Stress ECG: No significant change from baseline ECG  QPS Raw Data Images:  Normal; no motion artifact; normal heart/lung ratio. Stress Images:  Normal homogeneous uptake in all areas of the myocardium. Rest Images:  Normal homogeneous uptake in all areas of the myocardium. Subtraction (SDS):  No evidence of ischemia. Transient Ischemic Dilatation (Normal <1.22):  1.18 Lung/Heart Ratio (Normal <0.45):  0.39  Quantitative Gated Spect Images QGS EDV:  103 ml QGS ESV:  35 ml  Impression Exercise Capacity:  Good exercise capacity. BP Response:  Normal blood pressure response. Clinical Symptoms:  No significant symptoms noted. ECG Impression:  No significant ST segment change suggestive of ischemia. Comparison with Prior Nuclear Study: No significant change from previous study  Overall Impression:  Normal stress nuclear study.  LV Ejection Fraction: 66%.  LV Wall Motion:  NL LV Function; NL Wall Motion  Limited Brandshomas Sujata Maines

## 2013-05-15 ENCOUNTER — Telehealth: Payer: Self-pay | Admitting: Internal Medicine

## 2013-05-15 NOTE — Progress Notes (Signed)
Quick Note:  Patient's wife called for results.Results not provided. DPR on file with patient request not to give out information. (Marital separation) ______

## 2013-05-15 NOTE — Telephone Encounter (Signed)
Patient's wife called for test results. Patient has DPR on file with instructions NOT to release information (separated from wife). Wife expressed that she was upset that she had not heard back regarding test results yet today. She expressed that she was upset to learn that Dr. Tenny Crawoss was not in the office today either. Provided information to her that the MD's are not in clinic every day but also have duties at the hospital or performing procedures, however they do check results and call patients back most days as they have breaks in their schedule/procedures/hospital and ED duties. Wife asked for my name and the spelling of it. She asked what the next step is for complaints if she does not get a call with the test results within the next 24 hours. Provided my name/spelling and that of the clinical manager, Doylene BodeGina Lurz. I did not mention to her regarding the DPR on file regarding patient's request that information not be given out.  Routed to Dr. Tenny Crawoss.

## 2013-05-15 NOTE — Telephone Encounter (Signed)
Patient's wife called for test results. Patient has DPR on file with instructions NOT to release information (separated from wife). Wife expressed that she was upset that she had not heard back regarding test res

## 2013-05-15 NOTE — Telephone Encounter (Signed)
New message  Patients wife was very persistent in getting results of test that was done yesterday. She would like a call back ASAP.

## 2013-05-17 ENCOUNTER — Other Ambulatory Visit: Payer: Self-pay | Admitting: *Deleted

## 2013-05-17 DIAGNOSIS — R0789 Other chest pain: Secondary | ICD-10-CM

## 2013-05-17 DIAGNOSIS — Z8249 Family history of ischemic heart disease and other diseases of the circulatory system: Secondary | ICD-10-CM

## 2013-05-17 NOTE — Telephone Encounter (Signed)
Follow up     Still waiting for someone to call and give stress test results.  Patient has panic disorder and waiting for days for results is upsetting him.  Wife want someone to call her today.

## 2013-05-17 NOTE — Telephone Encounter (Signed)
Spoke with pt, aware of test results. 

## 2013-05-20 ENCOUNTER — Other Ambulatory Visit: Payer: Self-pay | Admitting: Internal Medicine

## 2013-06-25 ENCOUNTER — Other Ambulatory Visit: Payer: Self-pay | Admitting: Internal Medicine

## 2013-07-02 ENCOUNTER — Ambulatory Visit (INDEPENDENT_AMBULATORY_CARE_PROVIDER_SITE_OTHER): Payer: BC Managed Care – PPO | Admitting: Internal Medicine

## 2013-07-02 ENCOUNTER — Encounter: Payer: Self-pay | Admitting: Internal Medicine

## 2013-07-02 VITALS — BP 130/98 | HR 76 | Temp 98.5°F | Resp 16 | Wt 190.0 lb

## 2013-07-02 DIAGNOSIS — J40 Bronchitis, not specified as acute or chronic: Secondary | ICD-10-CM

## 2013-07-02 MED ORDER — AMOXICILLIN-POT CLAVULANATE 875-125 MG PO TABS: 1.0000 | ORAL_TABLET | Freq: Two times a day (BID) | ORAL | Status: AC

## 2013-07-02 MED ORDER — PROMETHAZINE-CODEINE 6.25-10 MG/5ML PO SYRP: 5.0000 mL | ORAL_SOLUTION | ORAL | Status: DC | PRN

## 2013-07-02 NOTE — Patient Instructions (Addendum)
Use over-the-counter  "cold" medicines  such as "Afrin" nasal spray for nasal congestion as directed . Use" Delsym" or" Robitussin" cough syrup varietis for cough.  You can use plain "Tylenol" or "Advil" for fever, chills and achyness.    Please, make an appointment if you are not better or if you're worse.

## 2013-07-02 NOTE — Progress Notes (Signed)
Pre visit review using our clinic review tool, if applicable. No additional management support is needed unless otherwise documented below in the visit note. 

## 2013-07-02 NOTE — Assessment & Plan Note (Signed)
Augmentin x10d Prom-cod syr 

## 2013-07-02 NOTE — Progress Notes (Signed)
Patient ID: Howard Gray, male   DOB: 09/03/1965, 48 y.o.   MRN: [id number]   Subjective:   C/o URI, SOB at times  Cough This is a new problem. The current episode started in the past 7 days. The problem has been gradually worsening. The problem occurs every few minutes. The cough is productive of purulent sputum. Pertinent negatives include no chest pain, eye redness, rash, sore throat or wheezing. There is no history of asthma.       BP Readings from Last 3 Encounters:  07/02/13 130/98  05/14/13 140/93  03/09/13 118/80   Wt Readings from Last 3 Encounters:  07/02/13 190 lb (86.183 kg)  05/14/13 186 lb (84.369 kg)  03/09/13 186 lb (84.369 kg)       .Review of Systems  Constitutional: Negative for appetite change, fatigue and unexpected weight change.  HENT: Negative for congestion, mouth sores, nosebleeds, sneezing, sore throat, tinnitus, trouble swallowing and voice change.   Eyes: Negative for redness, itching and visual disturbance.  Respiratory: Positive for cough. Negative for wheezing.   Cardiovascular: Negative for chest pain, palpitations and leg swelling.  Gastrointestinal: Negative for nausea, diarrhea, blood in stool and abdominal distention.  Genitourinary: Negative for frequency, hematuria and difficulty urinating.  Musculoskeletal: Negative for back pain, gait problem, joint swelling and neck pain.  Skin: Negative for rash.  Neurological: Negative for dizziness, tremors, speech difficulty and weakness.  Psychiatric/Behavioral: Negative for suicidal ideas, hallucinations, behavioral problems, sleep disturbance, self-injury, dysphoric mood and agitation. The patient is not nervous/anxious and is not hyperactive.        Objective:   Physical Exam  Constitutional: He is oriented to person, place, and time. He appears well-developed and well-nourished. No distress.  HENT:  Mouth/Throat: Oropharynx is clear and moist.  Eyes: Conjunctivae are normal. Pupils are  equal, round, and reactive to light. Left eye exhibits no discharge.  Hard hearing  Neck: Normal range of motion. No JVD present. No thyromegaly present.  Cardiovascular: Normal rate, regular rhythm, normal heart sounds and intact distal pulses.  Exam reveals no gallop and no friction rub.   No murmur heard. Pulmonary/Chest: Effort normal and breath sounds normal. No respiratory distress. He has no wheezes. He has no rales. He exhibits no tenderness.  Abdominal: Soft. Bowel sounds are normal. He exhibits no distension and no mass. There is no tenderness. There is no rebound and no guarding.  Musculoskeletal: Normal range of motion. He exhibits no edema and no tenderness.  Lymphadenopathy:    He has no cervical adenopathy.  Neurological: He is alert and oriented to person, place, and time. He has normal reflexes. No cranial nerve deficit. He exhibits normal muscle tone. Coordination normal.  Skin: Skin is warm and dry. No rash noted.  Psychiatric: He has a normal mood and affect. His behavior is normal. Judgment and thought content normal.  eryth throat    Lab Results  Component Value Date   WBC 7.6 06/27/2012   HGB 16.4 06/27/2012   HCT 47.5 06/27/2012   PLT 307.0 06/27/2012   GLUCOSE 97 06/27/2012   CHOL 217* 06/27/2012   TRIG 96.0 06/27/2012   HDL 57.30 06/27/2012   LDLDIRECT 140.7 06/27/2012   ALT 20 08/13/2011   AST 21 08/13/2011   NA 138 06/27/2012   K 4.4 06/27/2012   CL 99 06/27/2012   CREATININE 0.9 06/27/2012   BUN 15 06/27/2012   CO2 30 06/27/2012   TSH 1.10 06/27/2012   PSA 0.63 06/27/2012  INR 1.0 10/04/2006        Assessment & Plan:

## 2013-09-24 ENCOUNTER — Other Ambulatory Visit: Payer: Self-pay | Admitting: Internal Medicine

## 2013-09-28 ENCOUNTER — Telehealth: Payer: Self-pay | Admitting: Internal Medicine

## 2013-09-28 MED ORDER — AMLODIPINE-OLMESARTAN 10-40 MG PO TABS: 1.0000 | ORAL_TABLET | Freq: Every day | ORAL | Status: DC

## 2013-09-28 NOTE — Telephone Encounter (Signed)
Done

## 2013-09-28 NOTE — Telephone Encounter (Signed)
Patient's wife is calling to see if Dr. Posey ReaPlotnikov can send a prescription for AZOR 10-40 MG to [facility] on [address]. They do not have the money to pay for the 90-day supply that is ready for them to be picked up at the pharmacy and would be able to afford to pick-up a 30-day supply. Please advise.

## 2013-10-22 ENCOUNTER — Ambulatory Visit (INDEPENDENT_AMBULATORY_CARE_PROVIDER_SITE_OTHER): Payer: BC Managed Care – PPO | Admitting: Internal Medicine

## 2013-10-22 ENCOUNTER — Encounter: Payer: Self-pay | Admitting: Internal Medicine

## 2013-10-22 VITALS — BP 130/80 | HR 71 | Temp 98.4°F | Ht 69.0 in | Wt 193.0 lb

## 2013-10-22 DIAGNOSIS — F1020 Alcohol dependence, uncomplicated: Secondary | ICD-10-CM

## 2013-10-22 DIAGNOSIS — E538 Deficiency of other specified B group vitamins: Secondary | ICD-10-CM

## 2013-10-22 DIAGNOSIS — K2270 Barrett's esophagus without dysplasia: Secondary | ICD-10-CM

## 2013-10-22 DIAGNOSIS — K2920 Alcoholic gastritis without bleeding: Secondary | ICD-10-CM

## 2013-10-22 MED ORDER — RANITIDINE HCL 150 MG PO TABS: 150.0000 mg | ORAL_TABLET | Freq: Two times a day (BID) | ORAL | Status: DC

## 2013-10-22 NOTE — Assessment & Plan Note (Signed)
Worse - 7/15 - drinking 6 beers/day Discussed  Stop ETOH Added Zantac Cont Prevacid F/u w/GI

## 2013-10-22 NOTE — Patient Instructions (Addendum)
Stop drinking please Loose 10 lbs

## 2013-10-22 NOTE — Assessment & Plan Note (Signed)
On Prevacid Discussed

## 2013-10-22 NOTE — Progress Notes (Signed)
Pre visit review using our clinic review tool, if applicable. No additional management support is needed unless otherwise documented below in the visit note. 

## 2013-10-22 NOTE — Progress Notes (Signed)
Subjective:     Gastrophageal Reflux He complains of belching, chest pain, choking, dysphagia, early satiety and nausea. He reports no abdominal pain, no sore throat or no wheezing. This is a new (GERD is chronic ) problem. The current episode started more than 1 month ago. The problem has been waxing and waning. The symptoms are aggravated by caffeine, certain foods and ETOH (6 beers/d). Pertinent negatives include no fatigue, melena or weight loss. He has tried a PPI (BID Prevacid) for the symptoms. The treatment provided no relief.       BP Readings from Last 3 Encounters:  10/22/13 130/80  07/02/13 130/98  05/14/13 140/93   Wt Readings from Last 3 Encounters:  10/22/13 193 lb (87.544 kg)  07/02/13 190 lb (86.183 kg)  05/14/13 186 lb (84.369 kg)       .Review of Systems  Constitutional: Negative for weight loss, appetite change, fatigue and unexpected weight change.  HENT: Negative for congestion, mouth sores, nosebleeds, sneezing, sore throat, tinnitus, trouble swallowing and voice change.   Eyes: Negative for redness, itching and visual disturbance.  Respiratory: Positive for choking. Negative for wheezing.   Cardiovascular: Positive for chest pain. Negative for palpitations and leg swelling.  Gastrointestinal: Positive for dysphagia and nausea. Negative for abdominal pain, diarrhea, blood in stool, melena and abdominal distention.  Genitourinary: Negative for frequency, hematuria and difficulty urinating.  Musculoskeletal: Negative for back pain, gait problem, joint swelling and neck pain.  Neurological: Negative for dizziness, tremors, speech difficulty and weakness.  Psychiatric/Behavioral: Negative for suicidal ideas, hallucinations, behavioral problems, sleep disturbance, self-injury, dysphoric mood and agitation. The patient is not nervous/anxious and is not hyperactive.        Objective:   Physical Exam  Constitutional: He is oriented to person, place, and  time. He appears well-developed and well-nourished. No distress.  HENT:  Mouth/Throat: Oropharynx is clear and moist.  Eyes: Conjunctivae are normal. Pupils are equal, round, and reactive to light. Left eye exhibits no discharge.  Hard hearing  Neck: Normal range of motion. No JVD present. No thyromegaly present.  Cardiovascular: Normal rate, regular rhythm, normal heart sounds and intact distal pulses.  Exam reveals no gallop and no friction rub.   No murmur heard. Pulmonary/Chest: Effort normal and breath sounds normal. No respiratory distress. He has no wheezes. He has no rales. He exhibits no tenderness.  Abdominal: Soft. Bowel sounds are normal. He exhibits no distension and no mass. There is no tenderness. There is no rebound and no guarding.  Musculoskeletal: Normal range of motion. He exhibits no edema and no tenderness.  Lymphadenopathy:    He has no cervical adenopathy.  Neurological: He is alert and oriented to person, place, and time. He has normal reflexes. No cranial nerve deficit. He exhibits normal muscle tone. Coordination normal.  Skin: Skin is warm and dry. No rash noted.  Psychiatric: He has a normal mood and affect. His behavior is normal. Judgment and thought content normal.  eryth throat    Lab Results  Component Value Date   WBC 7.6 06/27/2012   HGB 16.4 06/27/2012   HCT 47.5 06/27/2012   PLT 307.0 06/27/2012   GLUCOSE 97 06/27/2012   CHOL 217* 06/27/2012   TRIG 96.0 06/27/2012   HDL 57.30 06/27/2012   LDLDIRECT 140.7 06/27/2012   ALT 20 08/13/2011   AST 21 08/13/2011   NA 138 06/27/2012   K 4.4 06/27/2012   CL 99 06/27/2012   CREATININE 0.9 06/27/2012   BUN 15  06/27/2012   CO2 30 06/27/2012   TSH 1.10 06/27/2012   PSA 0.63 06/27/2012   INR 1.0 10/04/2006        Assessment & Plan:

## 2013-10-22 NOTE — Assessment & Plan Note (Addendum)
Worse - 7/15 - drinking 6 beers/day Discussed  Potential risks of ETOH use and complications were explained to the patient and were aknowledged.

## 2013-10-22 NOTE — Assessment & Plan Note (Signed)
Continue with current prescription therapy as reflected on the Med list.  

## 2013-10-23 ENCOUNTER — Telehealth: Payer: Self-pay | Admitting: Gastroenterology

## 2013-10-23 NOTE — Telephone Encounter (Signed)
Pt has been scheduled with Shanda BumpsJessica 10/30/13 to discuss EGD hx of barretts and dysphagia

## 2013-10-30 ENCOUNTER — Ambulatory Visit (INDEPENDENT_AMBULATORY_CARE_PROVIDER_SITE_OTHER): Payer: BC Managed Care – PPO | Admitting: Gastroenterology

## 2013-10-30 ENCOUNTER — Encounter: Payer: Self-pay | Admitting: Gastroenterology

## 2013-10-30 VITALS — BP 134/72 | HR 68 | Ht 69.0 in | Wt 189.0 lb

## 2013-10-30 DIAGNOSIS — K219 Gastro-esophageal reflux disease without esophagitis: Secondary | ICD-10-CM | POA: Insufficient documentation

## 2013-10-30 DIAGNOSIS — R1310 Dysphagia, unspecified: Secondary | ICD-10-CM | POA: Insufficient documentation

## 2013-10-30 DIAGNOSIS — K2270 Barrett's esophagus without dysplasia: Secondary | ICD-10-CM

## 2013-10-30 NOTE — Patient Instructions (Signed)
You have been scheduled for an endoscopy. Please follow written instructions given to you at your visit today. If you use inhalers (even only as needed), please bring them with you on the day of your procedure. Your physician has requested that you go to www.startemmi.com and enter the access code given to you at your visit today. This web site gives a general overview about your procedure. However, you should still follow specific instructions given to you by our office regarding your preparation for the procedure.  Continue current medicaitons.

## 2013-10-30 NOTE — Progress Notes (Signed)
i agree with the plan above 

## 2013-10-30 NOTE — Progress Notes (Signed)
     10/30/2013 Howard [place]Stephen W Gray [id number] 01/14/1966   History of Present Illness:  This is a 48 year old male who is known to Dr. Christella HartiganJacobs for treatment of GERD/Barrett's esophagus.  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  It was recommended that he have a repeat EGD in 1 year from that time for surveillance, however, the patient never scheduled that procedure.  He comes in today with his wife with complaints of intermittent dysphagia to solid food.  Sometimes it feels like the food gets hung up or like there is a lump in his esophagus that the food must pass over in order to reach his stomach.  No problems swallowing liquids.  He has been taking prevacid 30 mg BID for several years, which works well for the most part, but due to his recent complaints of dysphagia, and some increase in reflux symptoms after eating, his PCP told him to add zantac 150 mg twice a day to his regimen as well.  He says that since adding that medication that his symptoms seem to be somewhat better.  He had still been drinking quite heavily until one week ago when his PCP told him to stop drinking.  He says that he has not had any ETOH in the past week.  Symptoms also seem to be better since not drinking ETOH as well.   Current Medications, Allergies, Past Medical History, Past Surgical History, Family History and Social History were reviewed in [facility] electronic medical record.   Physical Exam: BP 134/72  Pulse 68  Ht 5\' 9"  (1.753 m)  Wt 189 lb (85.73 kg)  BMI 27.90 kg/m2 General: Well developed white male in no acute distress Head: Normocephalic and atraumatic Eyes:  Sclerae anicteric, conjunctiva pink  Ears: Normal auditory acuity Lungs: Clear throughout to auscultation Heart: Regular rate and rhythm Abdomen: Soft, non-distended.  Normal bowel sounds.  Non-tender. Musculoskeletal: Symmetrical  with no gross deformities  Extremities: No edema  Neurological: Alert oriented x 4, grossly non-focal Psychological:  Alert and cooperative. Normal mood and affect  Assessment and Recommendations: -History of Barrett's/GERD and now with complaints of intermittent solid food dysphagia:  Will schedule EGD with possible dilation with Dr. Christella HartiganJacobs.  The risks, benefits, and alternatives were discussed with the patient and he consents to proceed. Continue on current medication regimen for now, but may want to consider changing the medication pending results of EGD.   -ETOH abuse:  No ETOH for one week.  This was likely also making GERD symptoms worse.

## 2013-11-13 ENCOUNTER — Encounter: Payer: Self-pay | Admitting: Gastroenterology

## 2013-11-13 ENCOUNTER — Ambulatory Visit (AMBULATORY_SURGERY_CENTER): Payer: BC Managed Care – PPO | Admitting: Gastroenterology

## 2013-11-13 VITALS — BP 123/80 | HR 58 | Temp 97.6°F | Resp 22 | Ht 69.0 in | Wt 189.0 lb

## 2013-11-13 DIAGNOSIS — K2270 Barrett's esophagus without dysplasia: Secondary | ICD-10-CM

## 2013-11-13 DIAGNOSIS — K219 Gastro-esophageal reflux disease without esophagitis: Secondary | ICD-10-CM

## 2013-11-13 DIAGNOSIS — R1310 Dysphagia, unspecified: Secondary | ICD-10-CM

## 2013-11-13 MED ORDER — SODIUM CHLORIDE 0.9 % IV SOLN: 500.0000 mL | INTRAVENOUS | Status: DC

## 2013-11-13 NOTE — Progress Notes (Signed)
Report to PACU, RN, vss, BBS= Clear.  

## 2013-11-13 NOTE — Op Note (Signed)
[facility] [address] [place], [id number]   ENDOSCOPY PROCEDURE REPORT  PATIENT: Howard MaineBotts, Layden W.  MR#: [id number] BIRTHDATE: [birth year] , 48  yrs. old GENDER: Male ENDOSCOPIST: Rachael Feeaniel P Gabrielly Mccrystal, MD PROCEDURE DATE:  11/13/2013 PROCEDURE:  EGD w/ biopsy ASA CLASS:     Class II INDICATIONS:  Short segment Barret's without dysplasia, EGD 08/2100, recall at one year interval.  Was sent letter in 2013 and also 2014 asking him to schedule the repeat EGD.Marland Kitchen. MEDICATIONS: MAC sedation, administered by CRNA and propofol (Diprivan) 200mg  IV TOPICAL ANESTHETIC: Cetacaine Spray  DESCRIPTION OF PROCEDURE: After the risks benefits and alternatives of the procedure were thoroughly explained, informed consent was obtained.  The LB [id number] endoscope was introduced through the mouth and advanced to the second portion of the duodenum. Without limitations.  The instrument was slowly withdrawn as the mucosa was fully examined.    There was a non-nodular, short segment of Barrett's appearing mucosa.  This was biopsied and sent to pathology.  There was moderate, non-specific gastritis.  Previously biospied 2010.  The examination was otherwise normal.  Retroflexed views revealed no abnormalities.     The scope was then withdrawn from the patient and the procedure completed.  COMPLICATIONS: There were no complications. ENDOSCOPIC IMPRESSION: There was a non-nodular, short segment of Barrett's appearing mucosa.  This was biopsied and sent to pathology.  There was moderate, non-specific gastritis.  Previously biospied 2010.  The examination was otherwise normal.  RECOMMENDATIONS: Await final pathology (you may need repeat EGD in 3 years for Barrett's surveillance).  You need to quit abusing alcohol, it is likely contributing to your GI issues.  You should stay on twice daily PPI (prevacid) for now.  You should chew your food well, eat slowly and take small  bites.   eSigned:  Rachael Feeaniel P Leeann Bady, MD 11/13/2013 10:48 AM   CC: Sonda PrimesAlex Plotnikov, MD

## 2013-11-13 NOTE — Patient Instructions (Signed)

## 2013-11-13 NOTE — Progress Notes (Signed)
Called to room to assist during endoscopic procedure.  Patient ID and intended procedure confirmed with present staff. Received instructions for my participation in the procedure from the performing physician.  

## 2013-11-14 ENCOUNTER — Telehealth: Payer: Self-pay | Admitting: *Deleted

## 2013-11-14 NOTE — Telephone Encounter (Signed)
  Follow up Call-  Call back number 11/13/2013  Post procedure Call Back phone  # [phone]  Permission to leave phone message Yes     Patient questions:  Do you have a fever, pain , or abdominal swelling? No. Pain Score  0 *  Have you tolerated food without any problems? Yes.    Have you been able to return to your normal activities? Yes.    Do you have any questions about your discharge instructions: Diet   No. Medications  No. Follow up visit  No.  Do you have questions or concerns about your Care? No.  Actions: * If pain score is 4 or above: No action needed, pain <4. Spoke with wife, patient at work.

## 2013-11-21 ENCOUNTER — Encounter: Payer: Self-pay | Admitting: Gastroenterology

## 2013-11-21 ENCOUNTER — Ambulatory Visit: Payer: BC Managed Care – PPO | Admitting: Internal Medicine

## 2014-01-03 ENCOUNTER — Other Ambulatory Visit: Payer: Self-pay | Admitting: Internal Medicine

## 2014-01-24 ENCOUNTER — Encounter: Payer: Self-pay | Admitting: Internal Medicine

## 2014-01-24 ENCOUNTER — Ambulatory Visit (INDEPENDENT_AMBULATORY_CARE_PROVIDER_SITE_OTHER): Payer: BC Managed Care – PPO | Admitting: Internal Medicine

## 2014-01-24 VITALS — BP 124/90 | HR 73 | Temp 98.7°F | Resp 13 | Wt 189.5 lb

## 2014-01-24 DIAGNOSIS — J069 Acute upper respiratory infection, unspecified: Secondary | ICD-10-CM

## 2014-01-24 DIAGNOSIS — J209 Acute bronchitis, unspecified: Secondary | ICD-10-CM

## 2014-01-24 MED ORDER — HYDROCODONE-HOMATROPINE 5-1.5 MG/5ML PO SYRP: 5.0000 mL | ORAL_SOLUTION | Freq: Four times a day (QID) | ORAL | Status: DC | PRN

## 2014-01-24 MED ORDER — AMOXICILLIN 500 MG PO CAPS: 500.0000 mg | ORAL_CAPSULE | Freq: Three times a day (TID) | ORAL | Status: DC

## 2014-01-24 NOTE — Progress Notes (Signed)
Pre visit review using our clinic review tool, if applicable. No additional management support is needed unless otherwise documented below in the visit note. 

## 2014-01-24 NOTE — Patient Instructions (Signed)

## 2014-01-24 NOTE — Progress Notes (Signed)
   Subjective:    Patient ID: Howard [place]Stephen W Galster, male    DOB: 09/13/1965, 48 y.o.   MRN: [id number]  HPI He's had a cough since 01/18/14 which is paroxysmal, lasting less than 1 minute. It has been productive of thick, brown/green sputum especially in the morning and at night.  He has associated frontal sinus congestion, postnasal drainage, and scratchy throat.  He's also had some itchy, watery eyes. He describes some sweating & also sneezing intermittently.  Mucinex was of marginal benefit. A prescription cough syrup was of benefit last night and helped him rest.  He quit smoking 3 years ago.   Review of Systems   He denies fever, chills, pleuritic chest pain, wheezing, or shortness of breath. He has no facial pain, or purulent nasal discharge.       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 The middle ear bones of the right are indistinct. The tympanic membrane is dull. He has nystagmus with lateral gaze bilaterally. There is a small polyp in the left nare.    General appearance:good health ;well nourished; no acute distress or increased work of breathing is present.  No  lymphadenopathy about the head, neck, or axilla noted. Eyes: No conjunctival inflammation or lid edema is present. There is no scleral icterus. Ears:  External ear exam shows no significant lesions or deformities.  Otoscopic examination reveals clear canals, tympanic membranes are intact bilaterally without bulging, retraction, inflammation or discharge. Nose:  External nasal examination shows no deformity or inflammation. Nasal mucosa are dry without exudates. No septal dislocation or deviation.No obstruction to airflow.  Oral exam: Dental hygiene is good; lips and gums are healthy appearing.There is  minor oropharyngeal erythema ;no exudate noted.  Neck:  No deformities, thyromegaly, masses, or tenderness noted.   Supple with full range of motion  without pain.  Heart:  Normal rate and regular rhythm. S1 and S2 normal without gallop, murmur, click, rub or other extra sounds. Lungs:Chest clear to auscultation; no wheezes, rhonchi,rales ,or rubs present.No increased work of breathing.   Extremities:  No cyanosis, edema, or clubbing  noted  Skin: Warm & dry w/o jaundice or tenting.        Assessment & Plan:  #1 acute bronchitis w/o bronchospasm #2 URI, acute Plan: See orders and recommendations

## 2014-03-08 ENCOUNTER — Other Ambulatory Visit: Payer: Self-pay

## 2014-03-08 ENCOUNTER — Encounter (HOSPITAL_COMMUNITY): Payer: Self-pay

## 2014-03-08 ENCOUNTER — Emergency Department (HOSPITAL_COMMUNITY)
Admission: EM | Admit: 2014-03-08 | Discharge: 2014-03-08 | Disposition: A | Discharge status: Home / Self Care | Payer: BC Managed Care – PPO | Attending: Emergency Medicine | Admitting: Emergency Medicine

## 2014-03-08 DIAGNOSIS — E538 Deficiency of other specified B group vitamins: Secondary | ICD-10-CM | POA: Insufficient documentation

## 2014-03-08 DIAGNOSIS — F419 Anxiety disorder, unspecified: Secondary | ICD-10-CM | POA: Diagnosis not present

## 2014-03-08 DIAGNOSIS — Z7952 Long term (current) use of systemic steroids: Secondary | ICD-10-CM | POA: Insufficient documentation

## 2014-03-08 DIAGNOSIS — Z87891 Personal history of nicotine dependence: Secondary | ICD-10-CM | POA: Diagnosis not present

## 2014-03-08 DIAGNOSIS — Z792 Long term (current) use of antibiotics: Secondary | ICD-10-CM | POA: Diagnosis not present

## 2014-03-08 DIAGNOSIS — Z79899 Other long term (current) drug therapy: Secondary | ICD-10-CM | POA: Diagnosis not present

## 2014-03-08 DIAGNOSIS — I10 Essential (primary) hypertension: Secondary | ICD-10-CM | POA: Insufficient documentation

## 2014-03-08 DIAGNOSIS — R0789 Other chest pain: Secondary | ICD-10-CM

## 2014-03-08 DIAGNOSIS — R079 Chest pain, unspecified: Secondary | ICD-10-CM | POA: Diagnosis present

## 2014-03-08 DIAGNOSIS — H9190 Unspecified hearing loss, unspecified ear: Secondary | ICD-10-CM | POA: Diagnosis not present

## 2014-03-08 DIAGNOSIS — K2270 Barrett's esophagus without dysplasia: Secondary | ICD-10-CM | POA: Diagnosis not present

## 2014-03-08 LAB — CBC WITH DIFFERENTIAL/PLATELET
BASOS ABS: 0 10*3/uL (ref 0.0–0.1)
Basophils Relative: 1 % (ref 0–1)
EOS ABS: 0.3 10*3/uL (ref 0.0–0.7)
EOS PCT: 6 % — AB (ref 0–5)
HCT: 43.1 % (ref 39.0–52.0)
Hemoglobin: 15.1 g/dL (ref 13.0–17.0)
Lymphocytes Relative: 30 % (ref 12–46)
Lymphs Abs: 1.4 10*3/uL (ref 0.7–4.0)
MCH: 31.9 pg (ref 26.0–34.0)
MCHC: 35 g/dL (ref 30.0–36.0)
MCV: 90.9 fL (ref 78.0–100.0)
Monocytes Absolute: 0.5 10*3/uL (ref 0.1–1.0)
Monocytes Relative: 11 % (ref 3–12)
NEUTROS PCT: 52 % (ref 43–77)
Neutro Abs: 2.4 10*3/uL (ref 1.7–7.7)
PLATELETS: 252 10*3/uL (ref 150–400)
RBC: 4.74 MIL/uL (ref 4.22–5.81)
RDW: 12.2 % (ref 11.5–15.5)
WBC: 4.7 10*3/uL (ref 4.0–10.5)

## 2014-03-08 LAB — D-DIMER, QUANTITATIVE (NOT AT ARMC): D-Dimer, Quant: <0.27 ug/mL-FEU (ref 0.00–0.48)

## 2014-03-08 NOTE — Discharge Instructions (Signed)
Chest Pain (Nonspecific) We did not feel that your chest pain is of a serious or dangerous nature. Call Dr. Posey ReaPlotnikov today or next week to schedule an office visit It is often hard to give a specific diagnosis for the cause of chest pain. There is always a chance that your pain could be related to something serious, such as a heart attack or a blood clot in the lungs. You need to follow up with your health care provider for further evaluation. CAUSES   Heartburn.  Pneumonia or bronchitis.  Anxiety or stress.  Inflammation around your heart (pericarditis) or lung (pleuritis or pleurisy).  A blood clot in the lung.  A collapsed lung (pneumothorax). It can develop suddenly on its own (spontaneous pneumothorax) or from trauma to the chest.  Shingles infection (herpes zoster virus). The chest wall is composed of bones, muscles, and cartilage. Any of these can be the source of the pain.  The bones can be bruised by injury.  The muscles or cartilage can be strained by coughing or overwork.  The cartilage can be affected by inflammation and become sore (costochondritis). DIAGNOSIS  Lab tests or other studies may be needed to find the cause of your pain. Your health care provider may have you take a test called an ambulatory electrocardiogram (ECG). An ECG records your heartbeat patterns over a 24-hour period. You may also have other tests, such as:  Transthoracic echocardiogram (TTE). During echocardiography, sound waves are used to evaluate how blood flows through your heart.  Transesophageal echocardiogram (TEE).  Cardiac monitoring. This allows your health care provider to monitor your heart rate and rhythm in real time.  Holter monitor. This is a portable device that records your heartbeat and can help diagnose heart arrhythmias. It allows your health care provider to track your heart activity for several days, if needed.  Stress tests by exercise or by giving medicine that makes the  heart beat faster. TREATMENT   Treatment depends on what may be causing your chest pain. Treatment may include:  Acid blockers for heartburn.  Anti-inflammatory medicine.  Pain medicine for inflammatory conditions.  Antibiotics if an infection is present.  You may be advised to change lifestyle habits. This includes stopping smoking and avoiding alcohol, caffeine, and chocolate.  You may be advised to keep your head raised (elevated) when sleeping. This reduces the chance of acid going backward from your stomach into your esophagus. Most of the time, nonspecific chest pain will improve within 2-3 days with rest and mild pain medicine.  HOME CARE INSTRUCTIONS   If antibiotics were prescribed, take them as directed. Finish them even if you start to feel better.  For the next few days, avoid physical activities that bring on chest pain. Continue physical activities as directed.  Do not use any tobacco products, including cigarettes, chewing tobacco, or electronic cigarettes.  Avoid drinking alcohol.  Only take medicine as directed by your health care provider.  Follow your health care provider's suggestions for further testing if your chest pain does not go away.  Keep any follow-up appointments you made. If you do not go to an appointment, you could develop lasting (chronic) problems with pain. If there is any problem keeping an appointment, call to reschedule. SEEK MEDICAL CARE IF:   Your chest pain does not go away, even after treatment.  You have a rash with blisters on your chest.  You have a fever. SEEK IMMEDIATE MEDICAL CARE IF:   You have increased chest pain  or pain that spreads to your arm, neck, jaw, back, or abdomen.  You have shortness of breath.  You have an increasing cough, or you cough up blood.  You have severe back or abdominal pain.  You feel nauseous or vomit.  You have severe weakness.  You faint.  You have chills. This is an emergency. Do  not wait to see if the pain will go away. Get medical help at once. Call your local emergency services (911 in [place] Do not drive yourself to the hospital. MAKE SURE YOU:   Understand these instructions.  Will watch your condition.  Will get help right away if you are not doing well or get worse. Document Released: 12/30/2004 Document Revised: 03/27/2013 Document Reviewed: 10/26/2007 [facility] Patient Information 2015 [place], [place]. This information is not intended to replace advice given to you by your health care provider. Make sure you discuss any questions you have with your health care provider.

## 2014-03-08 NOTE — ED Provider Notes (Signed)
CSN: [id number]     Arrival date & time 03/08/14  0702 History   First MD Initiated Contact with Patient 03/08/14 [phone]     Chief Complaint  Patient presents with  . Chest Pain     (Consider location/radiation/quality/duration/timing/severity/associated sxs/prior Treatment) HPI Complains of sudden onset chest pain left side anterior nonradiating diffusely over left chest and slightly over left shoulder 6 AM today felt "like a sledgehammer" pain is now minimal and localized to a 1 cm area at left parasternal border. No associated nausea shortness of breath or sweatiness. Nothing makes pain better or worse. He was treated with aspirin 324 mg prior to arrival. No other associated symptoms. Past Medical History  Diagnosis Date  . Anxiety   . GERD (gastroesophageal reflux disease)   . Alcoholism   . Hypertension   . Vitamin B12 deficiency   . Cluster headaches   . Barrett's esophagus    panic attack Past Surgical History  Procedure Laterality Date  . Cochlear implant    . Other surgical history      endoscopy   normal stress Myoview study February 2015 Family History  Problem Relation Age of Onset  . Colon cancer Neg Hx   . Heart disease Father 8146    MI, CAD  . Stroke Mother    both parents had MIs in 6040s History  Substance Use Topics  . Smoking status: Former Smoker -- 0.30 packs/day    Types: Cigarettes  . Smokeless tobacco: Former NeurosurgeonUser    Quit date: 11/26/2011  . Alcohol Use: Yes     Comment: hx of alcoholism    Review of Systems  Constitutional: Negative.   HENT: Positive for hearing loss.        Chronic hearing loss  Respiratory: Negative.   Cardiovascular: Positive for chest pain.  Gastrointestinal: Negative.   Musculoskeletal: Negative.   Skin: Negative.   Neurological: Negative.   Psychiatric/Behavioral: Negative.   All other systems reviewed and are negative.     Allergies  Review of patient's allergies indicates no known allergies.  Home Medications    Prior to Admission medications   Medication Sig Start Date End Date Taking? Authorizing Provider  ALPRAZolam Prudy Feeler(XANAX) 0.5 MG tablet Take 0.5-1 tablets (0.25-0.5 mg total) by mouth daily as needed. For anxiety 06/26/12   Aleksei Plotnikov V, MD  amLODipine-olmesartan (AZOR) 10-40 MG per tablet Take 1 tablet by mouth daily. 09/28/13   Aleksei Plotnikov V, MD  amoxicillin (AMOXIL) 500 MG capsule Take 1 capsule (500 mg total) by mouth 3 (three) times daily. 01/24/14   Pecola LawlessWilliam F Hopper, MD  Cholecalciferol (VITAMIN D3) 1000 UNITS CAPS Take 2 tablets by mouth daily.     Historical Provider, MD  Cyanocobalamin (VITAMIN B-12) 1000 MCG SUBL Place 1 tablet (1,000 mcg total) under the tongue daily. 11/02/12   Aleksei Plotnikov V, MD  fluvoxaMINE (LUVOX) 100 MG tablet Take 1 tablet (100 mg total) by mouth 2 (two) times daily. 06/26/12   Aleksei Plotnikov V, MD  fluvoxaMINE (LUVOX) 100 MG tablet TAKE 2 TABLETS EVERY DAY 01/04/14   Aleksei Plotnikov V, MD  HYDROcodone-homatropine (HYDROMET) 5-1.5 MG/5ML syrup Take 5 mLs by mouth every 6 (six) hours as needed for cough. 01/24/14   Pecola LawlessWilliam F Hopper, MD  lansoprazole (PREVACID) 30 MG capsule TAKE ONE CAPSULE TWICE A DAY 09/24/13   Aleksei Plotnikov V, MD  promethazine-codeine (PHENERGAN WITH CODEINE) 6.25-10 MG/5ML syrup Take 5 mLs by mouth every 4 (four) hours as needed for cough. 07/02/13  Aleksei Plotnikov V, MD  ranitidine (ZANTAC) 150 MG tablet Take 1 tablet (150 mg total) by mouth 2 (two) times daily. 10/22/13   Aleksei Plotnikov V, MD  triamcinolone ointment (KENALOG) 0.5 % Apply topically 2 (two) times daily. 02/02/13   Aleksei Plotnikov V, MD  Umeclidinium-Vilanterol (ANORO ELLIPTA) 62.5-25 MCG/INH AEPB Inhale 1 Act into the lungs daily. 02/27/13   Aleksei Plotnikov V, MD  Vitamins-Lipotropics ([employer] BALANCED B-100) TABS TAKE 1 TABLET BY MOUTH DAILY. 11/02/12   Aleksei Plotnikov V, MD   Pulse 69  Temp(Src) 98 F (36.7 C) (Oral)  Resp 12  SpO2 96% Physical  Exam  Constitutional: He appears well-developed and well-nourished.  HENT:  Head: Normocephalic and atraumatic.  Eyes: Conjunctivae are normal. Pupils are equal, round, and reactive to light.  Neck: Neck supple. No tracheal deviation present. No thyromegaly present.  Cardiovascular: Normal rate and regular rhythm.   No murmur heard. Pulmonary/Chest: Effort normal and breath sounds normal.  Abdominal: Soft. Bowel sounds are normal. He exhibits no distension. There is no tenderness.  Musculoskeletal: Normal range of motion. He exhibits no edema or tenderness.  Neurological: He is alert. Coordination normal.  Skin: Skin is warm and dry. No rash noted.  Psychiatric: He has a normal mood and affect.  Nursing note and vitals reviewed.   Date: 03/08/2014  Rate: 70  Rhythm: normal sinus rhythm  QRS Axis: normal  Intervals: normal  ST/T Wave abnormalities: normal  Conduction Disutrbances: none  Narrative Interpretation: unremarkable    Lab data i-STAT troponin resulted 8:26 AM 0.0, i-STAT 8 sodium 139 potassium 4.1 chloride 101 bicarbonate 29 glucose 104 BUN 10 creatinine 0.9 hemoglobin 16.0 ED Course  Procedures (including critical care time) Labs Review Labs Reviewed - No data to display  Imaging Review No results found.   EKG Interpretation None     second i-STAT troponin reported to me at 11:20 AM 0.0 Results for orders placed or performed during the hospital encounter of 03/08/14  D-dimer, quantitative  Result Value Ref Range   D-Dimer, Quant <0.27 0.00 - 0.48 ug/mL-FEU  CBC with Differential  Result Value Ref Range   WBC 4.7 4.0 - 10.5 K/uL   RBC 4.74 4.22 - 5.81 MIL/uL   Hemoglobin 15.1 13.0 - 17.0 g/dL   HCT 86.543.1 78.439.0 - 69.652.0 %   MCV 90.9 78.0 - 100.0 fL   MCH 31.9 26.0 - 34.0 pg   MCHC 35.0 30.0 - 36.0 g/dL   RDW 29.512.2 28.411.5 - 13.215.5 %   Platelets 252 150 - 400 K/uL   Neutrophils Relative % 52 43 - 77 %   Neutro Abs 2.4 1.7 - 7.7 K/uL   Lymphocytes Relative 30 12 -  46 %   Lymphs Abs 1.4 0.7 - 4.0 K/uL   Monocytes Relative 11 3 - 12 %   Monocytes Absolute 0.5 0.1 - 1.0 K/uL   Eosinophils Relative 6 (H) 0 - 5 %   Eosinophils Absolute 0.3 0.0 - 0.7 K/uL   Basophils Relative 1 0 - 1 %   Basophils Absolute 0.0 0.0 - 0.1 K/uL   No results found.  MDM  Strongly doubt cardiac etiology of pain with highly atypical symptoms, normal EKG, 2 negative troponins, normal Myoview less than 1 year ago. Very low pretest probability for pulmonary embolism. Normal d-dimer. Final diagnoses:  None   Plan follow-up Dr. Deforest HoylesPlotnkiov Diagnosis atypical chest pain     Doug SouSam Harlowe Dowler, MD 03/08/14 1623

## 2014-03-08 NOTE — ED Notes (Signed)
PER EMS: pt from home, reports sudden onset of left sided  CP lasting a few seconds that radiated to left shoulder and "it felt like getting hit in the chest with a sledge hammer," occurred twice. Pt is now pain free, NSR, A&OX4. Pt took 324 asa prior to EMS arrival.

## 2014-03-11 LAB — I-STAT TROPONIN, ED: TROPONIN I, POC: 0 ng/mL (ref 0.00–0.08)

## 2014-03-15 ENCOUNTER — Ambulatory Visit: Payer: BC Managed Care – PPO | Admitting: Internal Medicine

## 2014-03-18 ENCOUNTER — Telehealth: Payer: Self-pay | Admitting: Internal Medicine

## 2014-03-18 NOTE — Telephone Encounter (Signed)
Ok Zpac OV if worse Thx

## 2014-03-18 NOTE — Telephone Encounter (Signed)
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 She states he is having colored septum, heaviness in chest, cough, etc. He is requesting an antibiotic. Pt uses [facility] on [address]

## 2014-03-19 MED ORDER — AZITHROMYCIN 250 MG PO TABS: ORAL_TABLET | ORAL | Status: DC

## 2014-03-19 NOTE — Telephone Encounter (Signed)
Done. Left detailed mess informing pt's wife.   

## 2014-03-25 ENCOUNTER — Other Ambulatory Visit: Payer: Self-pay | Admitting: Internal Medicine

## 2014-03-27 ENCOUNTER — Ambulatory Visit (INDEPENDENT_AMBULATORY_CARE_PROVIDER_SITE_OTHER): Payer: BC Managed Care – PPO | Admitting: Internal Medicine

## 2014-03-27 ENCOUNTER — Encounter: Payer: Self-pay | Admitting: Internal Medicine

## 2014-03-27 VITALS — BP 134/86 | HR 74 | Temp 98.6°F | Ht 69.0 in | Wt 195.4 lb

## 2014-03-27 DIAGNOSIS — I10 Essential (primary) hypertension: Secondary | ICD-10-CM

## 2014-03-27 DIAGNOSIS — J209 Acute bronchitis, unspecified: Secondary | ICD-10-CM | POA: Insufficient documentation

## 2014-03-27 DIAGNOSIS — R062 Wheezing: Secondary | ICD-10-CM | POA: Insufficient documentation

## 2014-03-27 MED ORDER — PREDNISONE 10 MG PO TABS: ORAL_TABLET | ORAL | Status: DC

## 2014-03-27 MED ORDER — LEVOFLOXACIN 500 MG PO TABS: 500.0000 mg | ORAL_TABLET | Freq: Every day | ORAL | Status: DC

## 2014-03-27 MED ORDER — HYDROCODONE-HOMATROPINE 5-1.5 MG/5ML PO SYRP: 5.0000 mL | ORAL_SOLUTION | Freq: Four times a day (QID) | ORAL | Status: DC | PRN

## 2014-03-27 MED ORDER — METHYLPREDNISOLONE ACETATE 80 MG/ML IJ SUSP
80.0000 mg | Freq: Once | INTRAMUSCULAR | Status: AC
  Administered 2014-03-27: 80 mg via INTRAMUSCULAR

## 2014-03-27 NOTE — Progress Notes (Signed)
Subjective:    Patient ID: Howard Gray, male    DOB: 07/13/1965, 48 y.o.   MRN: [id number]  HPI  Here with 2-3 days acute onset fever, facial pain, pressure, headache, general weakness and malaise, and greenish d/c, with mild ST and cough, but pt denies chest pain, wheezing, increased sob or doe, orthopnea, PND, increased LE swelling, palpitations, dizziness or syncope, except for onset mild wheezing.sob since last PM Past Medical History  Diagnosis Date  . Anxiety   . GERD (gastroesophageal reflux disease)   . Alcoholism   . Hypertension   . Vitamin B12 deficiency   . Cluster headaches   . Barrett's esophagus    Past Surgical History  Procedure Laterality Date  . Cochlear implant    . Other surgical history      endoscopy    reports that he has quit smoking. His smoking use included Cigarettes. He smoked 0.30 packs per day. He quit smokeless tobacco use about 2 years ago. He reports that he drinks alcohol. He reports that he does not use illicit drugs. family history includes Heart disease (age of onset: 6946) in his father; Stroke in his mother. There is no history of Colon cancer. No Known Allergies Current Outpatient Prescriptions on File Prior to Visit  Medication Sig Dispense Refill  . ALPRAZolam (XANAX) 0.5 MG tablet Take 0.5-1 tablets (0.25-0.5 mg total) by mouth daily as needed. For anxiety (Patient taking differently: Take 0.25-0.5 mg by mouth 3 (three) times daily as needed. For anxiety) 60 tablet 2  . amLODipine-olmesartan (AZOR) 10-40 MG per tablet Take 1 tablet by mouth daily. 30 tablet 2  . aspirin 81 MG chewable tablet Chew 324 mg by mouth once.    . AZOR 10-40 MG per tablet TAKE 1 TABLET BY MOUTH EVERY DAY FOR BLOOD PRESSURE 90 tablet 3  . bismuth subsalicylate (PEPTO BISMOL) 262 MG/15ML suspension Take 30 mLs by mouth every 6 (six) hours as needed for diarrhea or loose stools.    . Cholecalciferol (VITAMIN D3) 1000 UNITS CAPS Take 2 tablets by mouth daily.     .  Cyanocobalamin (VITAMIN B-12) 1000 MCG SUBL Place 1 tablet (1,000 mcg total) under the tongue daily. 100 tablet 3  . fluvoxaMINE (LUVOX) 100 MG tablet Take 1 tablet (100 mg total) by mouth 2 (two) times daily. 180 tablet 2  . fluvoxaMINE (LUVOX) 100 MG tablet TAKE 2 TABLETS EVERY DAY 180 tablet 1  . lansoprazole (PREVACID) 30 MG capsule TAKE ONE CAPSULE TWICE A DAY 180 capsule 3  . loperamide (IMODIUM) 2 MG capsule Take 2 mg by mouth as needed for diarrhea or loose stools.    . promethazine-codeine (PHENERGAN WITH CODEINE) 6.25-10 MG/5ML syrup Take 5 mLs by mouth every 4 (four) hours as needed for cough. 300 mL 0  . ranitidine (ZANTAC) 150 MG tablet Take 1 tablet (150 mg total) by mouth 2 (two) times daily. (Patient taking differently: Take 150 mg by mouth 2 (two) times daily as needed for heartburn. ) 180 tablet 3  . triamcinolone ointment (KENALOG) 0.5 % Apply topically 2 (two) times daily. (Patient taking differently: Apply 1 application topically 2 (two) times daily as needed (eczema). ) 30 g 2  . Umeclidinium-Vilanterol (ANORO ELLIPTA) 62.5-25 MCG/INH AEPB Inhale 1 Act into the lungs daily. 1 each 5  . Vitamins-Lipotropics ([facility] BALANCED B-100) TABS TAKE 1 TABLET BY MOUTH DAILY. 100 tablet 3   Current Facility-Administered Medications on File Prior to Visit  Medication Dose Route Frequency  Provider Last Rate Last Dose  . methylPREDNISolone acetate (DEPO-MEDROL) injection 20 mg  20 mg Intra-Lesional Once Aleksei Plotnikov V, MD       Review of Systems  Constitutional: Negative for unusual diaphoresis or other sweats  HENT: Negative for ringing in ear Eyes: Negative for double vision or worsening visual disturbance.  Respiratory: Negative for choking and stridor.   Gastrointestinal: Negative for vomiting or other signifcant bowel change Genitourinary: Negative for hematuria or decreased urine volume.  Musculoskeletal: Negative for other MSK pain or swelling Skin: Negative for color change  and worsening wound.  Neurological: Negative for tremors and numbness other than noted  Psychiatric/Behavioral: Negative for decreased concentration or agitation other than above       Objective:   Physical Exam BP 134/86 mmHg  Pulse 74  Temp(Src) 98.6 F (37 C) (Oral)  Ht 5\' 9"  (1.753 m)  Wt 195 lb 6 oz (88.622 kg)  BMI 28.84 kg/m2  SpO2 96% VS noted,  Constitutional: Pt appears well-developed, well-nourished.  HENT: Head: NCAT.  Right Ear: External ear normal.  Left Ear: External ear normal.  Eyes: . Pupils are equal, round, and reactive to light. Conjunctivae and EOM are normal Bilat tm's with mild erythema.  Max sinus areas mild tender.  Pharynx with mild erythema, no exudate Neck: Normal range of motion. Neck supple.  Cardiovascular: Normal rate and regular rhythm.   Pulmonary/Chest: Effort normal and breath sounds with mild decreased BS, few bilat wheezes Neurological: Pt is alert. Not confused , motor grossly intact Skin: Skin is warm. No rash Psychiatric: Pt behavior is normal. No agitation.     Assessment & Plan:

## 2014-03-27 NOTE — Progress Notes (Signed)
Pre visit review using our clinic review tool, if applicable. No additional management support is needed unless otherwise documented below in the visit note. 

## 2014-03-27 NOTE — Patient Instructions (Signed)
You had the steroid shot today  Please take all new medication as prescribed - the antibiotic  Please continue all other medications as before, and refills have been done if requested  - the cough medicine  Please have the pharmacy call with any other refills you may need.  Please keep your appointments with your specialists as you may have planned

## 2014-03-31 NOTE — Assessment & Plan Note (Signed)
Mild to mod, for antibx course,  to f/u any worsening symptoms or concerns 

## 2014-03-31 NOTE — Assessment & Plan Note (Signed)
stable overall by history and exam, recent data reviewed with pt, and pt to continue medical treatment as before,  to f/u any worsening symptoms or concerns BP Readings from Last 3 Encounters:  03/27/14 134/86  03/08/14 135/86  01/24/14 124/90

## 2014-03-31 NOTE — Assessment & Plan Note (Signed)
Mild, for depomedrol IM,  to f/u any worsening symptoms or concerns 

## 2014-04-10 ENCOUNTER — Ambulatory Visit (INDEPENDENT_AMBULATORY_CARE_PROVIDER_SITE_OTHER): Payer: BLUE CROSS/BLUE SHIELD | Admitting: Internal Medicine

## 2014-04-10 ENCOUNTER — Encounter: Payer: Self-pay | Admitting: Internal Medicine

## 2014-04-10 VITALS — BP 140/100 | HR 78 | Temp 98.4°F | Resp 12 | Ht 69.0 in | Wt 195.0 lb

## 2014-04-10 DIAGNOSIS — S90852A Superficial foreign body, left foot, initial encounter: Secondary | ICD-10-CM

## 2014-04-10 DIAGNOSIS — F1020 Alcohol dependence, uncomplicated: Secondary | ICD-10-CM

## 2014-04-10 NOTE — Progress Notes (Signed)
Subjective:   C/o L foot pain, stepped on glass Sunday pm Hurts to walk on it  Gastrophageal Reflux He complains of chest pain, choking and nausea. He reports no abdominal pain, no sore throat or no wheezing. Pertinent negatives include no fatigue.       BP Readings from Last 3 Encounters:  04/10/14 140/100  03/27/14 134/86  03/08/14 135/86   Wt Readings from Last 3 Encounters:  04/10/14 195 lb (88.451 kg)  03/27/14 195 lb 6 oz (88.622 kg)  01/24/14 189 lb 8 oz (85.957 kg)       .Review of Systems  Constitutional: Negative for appetite change, fatigue and unexpected weight change.  HENT: Negative for congestion, mouth sores, nosebleeds, sneezing, sore throat, tinnitus, trouble swallowing and voice change.   Eyes: Negative for redness, itching and visual disturbance.  Respiratory: Positive for choking. Negative for wheezing.   Cardiovascular: Positive for chest pain. Negative for palpitations and leg swelling.  Gastrointestinal: Positive for nausea. Negative for abdominal pain, diarrhea, blood in stool and abdominal distention.  Genitourinary: Negative for frequency, hematuria and difficulty urinating.  Musculoskeletal: Negative for back pain, joint swelling, gait problem and neck pain.  Neurological: Negative for dizziness, tremors, speech difficulty and weakness.  Psychiatric/Behavioral: Negative for suicidal ideas, hallucinations, behavioral problems, sleep disturbance, self-injury, dysphoric mood and agitation. The patient is not nervous/anxious and is not hyperactive.        Objective:   Physical Exam  Constitutional: He is oriented to person, place, and time. He appears well-developed. No distress.  NAD  HENT:  Mouth/Throat: Oropharynx is clear and moist.  Eyes: Conjunctivae are normal. Pupils are equal, round, and reactive to light.  Neck: Normal range of motion. No JVD present. No thyromegaly present.  Cardiovascular: Normal rate, regular rhythm, normal  heart sounds and intact distal pulses.  Exam reveals no gallop and no friction rub.   No murmur heard. Pulmonary/Chest: Effort normal and breath sounds normal. No respiratory distress. He has no wheezes. He has no rales. He exhibits no tenderness.  Abdominal: Soft. Bowel sounds are normal. He exhibits no distension and no mass. There is no tenderness. There is no rebound and no guarding.  Musculoskeletal: Normal range of motion. He exhibits tenderness. He exhibits no edema.  Lymphadenopathy:    He has no cervical adenopathy.  Neurological: He is alert and oriented to person, place, and time. He has normal reflexes. No cranial nerve deficit. He exhibits normal muscle tone. He displays a negative Romberg sign. Coordination and gait normal.  No meningeal signs  Skin: Skin is warm and dry. No rash noted.  Psychiatric: He has a normal mood and affect. His behavior is normal. Judgment and thought content normal.    eryth throat L foot w/painful small puncure wound over 5th MTP head; callus    Procedure note:  Glass removal  Indication : a piece of glass in the foot  Risks including unsuccessful procedure , possible need for a repeat procedure, scar formation, and others as well as benefits were explained to the patient and her dtr in detail. Consent was obtained/signed.    The patient was placed in a decubitus position. The area on L foot wound was prepped with povidone-iodine. Local w/2 cc Lido w/epi. Dead skin was removed w/scalpel. 6 mm incision was made and wound probed w/splinter foreceps. A flat piece of glass was felt 5-6 mm deep. It was removed (brown glass 1x3x4 mm) and given to the pt in a bag.  The wound was probed again, irrigated and dressed with silvadine cream and a band-aid. Coban wrap.  Tolerated well.  Complications: None.  Wound instructions provided.       Lab Results  Component Value Date   WBC 4.7 03/08/2014   HGB 15.1 03/08/2014   HCT 43.1 03/08/2014   PLT  252 03/08/2014   GLUCOSE 97 06/27/2012   CHOL 217* 06/27/2012   TRIG 96.0 06/27/2012   HDL 57.30 06/27/2012   LDLDIRECT 140.7 06/27/2012   ALT 20 08/13/2011   AST 21 08/13/2011   NA 138 06/27/2012   K 4.4 06/27/2012   CL 99 06/27/2012   CREATININE 0.9 06/27/2012   BUN 15 06/27/2012   CO2 30 06/27/2012   TSH 1.10 06/27/2012   PSA 0.63 06/27/2012   INR 1.0 10/04/2006        Assessment & Plan:  Patient ID: Howard Gray, male   DOB: 05/25/1965, 49 y.o.   MRN: [id number]

## 2014-04-10 NOTE — Progress Notes (Signed)
Pre visit review using our clinic review tool, if applicable. No additional management support is needed unless otherwise documented below in the visit note. 

## 2014-04-14 DIAGNOSIS — S90852A Superficial foreign body, left foot, initial encounter: Secondary | ICD-10-CM | POA: Insufficient documentation

## 2014-04-14 NOTE — Assessment & Plan Note (Signed)
Discussed.

## 2014-04-14 NOTE — Assessment & Plan Note (Signed)
Options discussed - see Procedure

## 2014-05-16 ENCOUNTER — Ambulatory Visit: Payer: BLUE CROSS/BLUE SHIELD | Admitting: Internal Medicine

## 2014-07-18 ENCOUNTER — Other Ambulatory Visit: Payer: Self-pay | Admitting: Internal Medicine

## 2014-07-22 ENCOUNTER — Telehealth: Payer: Self-pay | Admitting: Gastroenterology

## 2014-07-22 NOTE — Telephone Encounter (Signed)
Pt has been scheduled for appt with Amy on 07/29/14 for painful, bleeding hemorrhoids.  Pts wife states that the pt says he is having blood stain his underwear and is very painful.  He was given the appt with Amy and put on the wait list

## 2014-07-22 NOTE — Telephone Encounter (Signed)
Pt is calling back said Pt is in severe pain and leaving work.  Explained if he is in that much pain he may want to go to a ED.  Pt's wife declined and became very upset

## 2014-07-29 ENCOUNTER — Encounter: Payer: Self-pay | Admitting: Physician Assistant

## 2014-07-29 ENCOUNTER — Ambulatory Visit (INDEPENDENT_AMBULATORY_CARE_PROVIDER_SITE_OTHER): Payer: BLUE CROSS/BLUE SHIELD | Admitting: Physician Assistant

## 2014-07-29 VITALS — BP 114/70 | HR 72

## 2014-07-29 DIAGNOSIS — K2270 Barrett's esophagus without dysplasia: Secondary | ICD-10-CM

## 2014-07-29 DIAGNOSIS — K6289 Other specified diseases of anus and rectum: Secondary | ICD-10-CM | POA: Diagnosis not present

## 2014-07-29 DIAGNOSIS — K625 Hemorrhage of anus and rectum: Secondary | ICD-10-CM

## 2014-07-29 MED ORDER — HYDROCORTISONE ACETATE 25 MG RE SUPP: 25.0000 mg | Freq: Two times a day (BID) | RECTAL | Status: DC

## 2014-07-29 MED ORDER — LIDOCAINE 5 % EX OINT: 1.0000 "application " | TOPICAL_OINTMENT | CUTANEOUS | Status: AC | PRN

## 2014-07-29 NOTE — Patient Instructions (Signed)
You have been scheduled for a colonoscopy. Please follow written instructions given to you at your visit today.  Please pick up your prep supplies at the pharmacy within the next 1-3 days. If you use inhalers (even only as needed), please bring them with you on the day of your procedure. Your physician has requested that you go to www.startemmi.com and enter the access code given to you at your visit today. This web site gives a general overview about your procedure. However, you should still follow specific instructions given to you by our office regarding your preparation for the procedure.  Your prescriptions will be sent to your pharmacy [facility](Lidocain,Anusol)  Your 1st banding is scheduled with Dr Leone PayorGessner on 09/16/2014 at 3:15pm

## 2014-07-29 NOTE — Progress Notes (Signed)
Patient ID: Howard Gray, male   DOB: 1965-05-06, 49 y.o.   MRN: [id number]   Subjective:    Patient ID: Howard Gray, male    DOB: [birth year], 49 y.o.   MRN: [id number]  HPI [name] is a pleasant 49 year old white male known to Dr. Christella Hartigan who has history of Barrett's esophagus. He was last seen in the office in July 2015 and then underwent subsequent upper endoscopy in August 2015 with finding of short segment of Barrett's biopsy showed no intestinal metaplasia and he is slated for 3 year interval follow-up. Patient does have history of chronic EtOH use hypertension, chronic anxiety, and history of Bell's palsy . He has a hearing impairment. Comes to the office today with complaints of hemorrhoids. Add symptoms for a year or more but over the past couple of months he has had daily symptoms with rectal pain discomfort and bleeding. He is been using over-the-counter products without much success. He says he has seen blood with each bowel movement usually just on the tissue. He has a hard time keeping himself clean. He says usually hurts for about an hour after a bowel movement and also is more uncomfortable if he has been standing for long periods of time or walking. He is not having any problems with abdominal pain no problems with constipation and excessive straining etc. No prior colonoscopy. Family history is negative for colon cancer..  Review of Systems Pertinent positive and negative review of systems were noted in the above HPI section.  All other review of systems was otherwise negative.  Outpatient Encounter Prescriptions as of 07/29/2014  Medication Sig  . ALPRAZolam (XANAX) 0.5 MG tablet Take 0.5-1 tablets (0.25-0.5 mg total) by mouth daily as needed. For anxiety (Patient taking differently: Take 0.25-0.5 mg by mouth 3 (three) times daily as needed. For anxiety)  . amLODipine-olmesartan (AZOR) 10-40 MG per tablet Take 1 tablet by mouth daily.  Marland Kitchen aspirin 81 MG chewable tablet Chew 324 mg  by mouth once.  . AZOR 10-40 MG per tablet TAKE 1 TABLET BY MOUTH EVERY DAY FOR BLOOD PRESSURE  . Cholecalciferol (VITAMIN D3) 1000 UNITS CAPS Take 2 tablets by mouth daily.   . Cyanocobalamin (VITAMIN B-12) 1000 MCG SUBL Place 1 tablet (1,000 mcg total) under the tongue daily.  . fluvoxaMINE (LUVOX) 100 MG tablet Take 1 tablet (100 mg total) by mouth 2 (two) times daily.  . fluvoxaMINE (LUVOX) 100 MG tablet TAKE 2 TABLETS EVERY DAY  . lansoprazole (PREVACID) 30 MG capsule TAKE ONE CAPSULE TWICE A DAY  . Vitamins-Lipotropics ([employer] BALANCED B-100) TABS TAKE 1 TABLET BY MOUTH DAILY.  . hydrocortisone (ANUSOL-HC) 25 MG suppository Place 1 suppository (25 mg total) rectally every 12 (twelve) hours.  . lidocaine (XYLOCAINE) 5 % ointment Apply 1 application topically as needed.  . [DISCONTINUED] bismuth subsalicylate (PEPTO BISMOL) 262 MG/15ML suspension Take 30 mLs by mouth every 6 (six) hours as needed for diarrhea or loose stools.  . [DISCONTINUED] HYDROcodone-homatropine (HYDROMET) 5-1.5 MG/5ML syrup Take 5 mLs by mouth every 6 (six) hours as needed for cough. (Patient not taking: Reported on 04/10/2014)  . [DISCONTINUED] levofloxacin (LEVAQUIN) 500 MG tablet Take 1 tablet (500 mg total) by mouth daily. (Patient not taking: Reported on 04/10/2014)  . [DISCONTINUED] loperamide (IMODIUM) 2 MG capsule Take 2 mg by mouth as needed for diarrhea or loose stools.  . [DISCONTINUED] predniSONE (DELTASONE) 10 MG tablet 2 tabs by mouth per day for 5 days (Patient not taking: Reported on 04/10/2014)  . [  DISCONTINUED] promethazine-codeine (PHENERGAN WITH CODEINE) 6.25-10 MG/5ML syrup Take 5 mLs by mouth every 4 (four) hours as needed for cough. (Patient not taking: Reported on 04/10/2014)  . [DISCONTINUED] ranitidine (ZANTAC) 150 MG tablet Take 1 tablet (150 mg total) by mouth 2 (two) times daily. (Patient not taking: Reported on 04/10/2014)  . [DISCONTINUED] triamcinolone ointment (KENALOG) 0.5 % Apply topically 2 (two)  times daily. (Patient not taking: Reported on 04/10/2014)  . [DISCONTINUED] Umeclidinium-Vilanterol (ANORO ELLIPTA) 62.5-25 MCG/INH AEPB Inhale 1 Act into the lungs daily. (Patient not taking: Reported on 04/10/2014)   No Known Allergies Patient Active Problem List   Diagnosis Date Noted  . Foreign body in foot, left 04/14/2014  . Acute bronchitis 03/27/2014  . Wheezing 03/27/2014  . Dysphagia, unspecified(787.20) 10/30/2013  . Gastroesophageal reflux disease without esophagitis 10/30/2013  . Barrett's esophagus 10/22/2013  . 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 Family history of premature CAD 02/27/2013  . Rash and nonspecific skin eruption 11/02/2012  . Well adult exam 06/26/2012  . Chest pain, atypical 11/26/2011  . Vertigo 08/27/2011  . Chronic sinusitis 08/27/2011  . Low back pain 04/13/2011  . Shoulder pain 01/01/2011  . Bronchitis 01/01/2011  . Generalized abdominal pain 08/31/2010  . ELBOW PAIN 10/20/2009  . VITAMIN B12 DEFICIENCY 09/19/2009  . Polydipsia 09/19/2009  . Carpal tunnel syndrome 04/30/2009  . PARESTHESIA 04/30/2009  . TOBACCO USE, QUIT 04/30/2009  . ARM PAIN 11/19/2008  . SKIN RASH 11/19/2008  . ALLERGIC RHINITIS 07/31/2008  . Essential hypertension 02/13/2008  . Pneumonia, organism unspecified 01/22/2008  . TREMOR, ESSENTIAL 04/27/2007  . DEAFNESS 04/27/2007  . ANXIETY 11/03/2006  . PANIC ATTACK 11/03/2006  . Alcoholism 11/03/2006  . Bell's palsy 11/03/2006  . GERD 11/03/2006   History   Social History  . Marital Status: Legally Separated    Spouse Name: N/A  . Number of Children: 1  . Years of Education: N/A   Occupational History  . [employer]   . 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 Smoking status: Former Smoker -- 0.30 packs/day    Types: Cigarettes  . Smokeless tobacco: Former Neurosurgeon    Quit date: 11/26/2011  . Alcohol Use: Yes     Comment: hx of alcoholism  . Drug Use: No  . Sexual Activity: Yes   Other Topics Concern  .  Not on file   Social History Narrative    Mr. Calma's family history includes Heart disease (age of onset: 41) in his father; Stroke in his mother. There is no history of Colon cancer.      Objective:    Filed Vitals:   07/29/14 1331  BP: 114/70  Pulse: 72    Physical Exam  well-developed anxious white male in no acute distress accompanied by his wife he is very hard of hearing but reads lips well blood pressure 114/70 pulse 72. HEENT; nontraumatic normocephalic EOMI PERRLA sclera anicteric, Supple no JVD, Cardiovascular; regular rate and rhythm with S1-S2 no murmur or gallop, Pulmonary; clear bilaterally, Abdomen ;soft nontender nondistended bowel sounds are active there is no palpable mass or hepatosplenomegaly, Rectal; exam no external hemorrhoids or lesions noted on digital exam no palpable fissure or other lesion , scant stool in the rectal vault heme negative., Extremities; no clubbing cyanosis or edema skin warm and dry, Psych; mood and affect appropriate       Assessment & Plan:   #1 49 yo male with several month hx of rectal discomfort ,rectal bleeding- likely secondary  to internal hemorrhoids R/o proctitis ,occult lesion #2 chronic ETOH #3 hearing impairment #4 Barretts Esophagus - EGD 11/2013- no intestinal metaplasia #5 anxiety #6 HTN  Plan; Schedule for Colonoscopy with Dr Christella HartiganJacobs- procedure discussed in detail with pt and he is agreeable to proceed. Pt and wife very interested in definitive management of internal hemorrhoids if colonoscopy otherwise negative- we discussed hemorrhoidal banding, and they want to pursue this as soon as possible post colonoscopy- have made a tentative appt with Dr Leone PayorGessner for banding-if internal hemorrhoids found.  In the interim he will use Anusol hc Supp qhs, and Lidocaine gel 5 % 3-4 times daily as needed.   Lynell Kussman S Sharina Petre PA-C 07/29/2014   Cc: Plotnikov, Georgina QuintAleksei V, MD

## 2014-07-29 NOTE — Progress Notes (Signed)
I agree with the above note, plan 

## 2014-07-31 ENCOUNTER — Telehealth: Payer: Self-pay | Admitting: Physician Assistant

## 2014-07-31 ENCOUNTER — Other Ambulatory Visit: Payer: Self-pay

## 2014-07-31 NOTE — Telephone Encounter (Signed)
You saw him for this and he has a colonoscopy as well banding scheduled. Please advise.

## 2014-07-31 NOTE — Telephone Encounter (Signed)
That is fine- until after colonoscopy to determine if hemorrhoids are actually the problem

## 2014-08-02 ENCOUNTER — Other Ambulatory Visit: Payer: Self-pay

## 2014-08-02 NOTE — Telephone Encounter (Signed)
Spoke with the patient. He asks the letter be faxed to [phone]. See letter for detail.

## 2014-08-06 ENCOUNTER — Encounter: Payer: Self-pay | Admitting: Gastroenterology

## 2014-08-13 ENCOUNTER — Telehealth: Payer: Self-pay | Admitting: Gastroenterology

## 2014-08-13 NOTE — Telephone Encounter (Signed)
No charge, please recommend he reschedule it though.

## 2014-08-14 ENCOUNTER — Encounter: Payer: BLUE CROSS/BLUE SHIELD | Admitting: Gastroenterology

## 2014-08-15 ENCOUNTER — Encounter: Payer: Self-pay | Admitting: *Deleted

## 2014-08-15 ENCOUNTER — Ambulatory Visit (INDEPENDENT_AMBULATORY_CARE_PROVIDER_SITE_OTHER): Payer: BLUE CROSS/BLUE SHIELD | Admitting: Family Medicine

## 2014-08-15 ENCOUNTER — Encounter: Payer: Self-pay | Admitting: Family Medicine

## 2014-08-15 VITALS — BP 120/80 | HR 80 | Temp 98.1°F | Ht 69.0 in | Wt 192.7 lb

## 2014-08-15 DIAGNOSIS — J069 Acute upper respiratory infection, unspecified: Secondary | ICD-10-CM

## 2014-08-15 MED ORDER — HYDROCODONE-HOMATROPINE 5-1.5 MG/5ML PO SYRP: 5.0000 mL | ORAL_SOLUTION | Freq: Every evening | ORAL | Status: DC | PRN

## 2014-08-15 NOTE — Progress Notes (Signed)
Pre visit review using our clinic review tool, if applicable. No additional management support is needed unless otherwise documented below in the visit note. 

## 2014-08-15 NOTE — Progress Notes (Signed)
HPI:  URI: -started: 2-3 days ago -symptoms:nasal congestion, sore throat, cough, PND, conjunctivitis -  irritated eyes bilat and clear drainage -denies:fever, SOB, NVD, tooth pain, vision changes -has tried: he used his sons abx eye drops, delsum -sick contacts/travel/risks: son has been sick, denies flu exposure, tick exposure or or Ebola risks  ROS: See pertinent positives and negatives per HPI.  Past Medical History  Diagnosis Date  . Anxiety   . GERD (gastroesophageal reflux disease)   . Alcoholism   . Hypertension   . Vitamin B12 deficiency   . Cluster headaches   . Barrett's esophagus     Past Surgical History  Procedure Laterality Date  . Cochlear implant    . Other surgical history      endoscopy    Family History  Problem Relation Age of Onset  . Colon cancer Neg Hx   . Heart disease Father 1946    MI, CAD  . Stroke Mother     History   Social History  . Marital Status: Legally Separated    Spouse Name: N/A  . Number of Children: 1  . Years of Education: N/A   Occupational History  . [employer]   . 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 Smoking status: Former Smoker -- 0.30 packs/day    Types: Cigarettes  . Smokeless tobacco: Former NeurosurgeonUser    Quit date: 11/26/2011  . Alcohol Use: Yes     Comment: hx of alcoholism  . Drug Use: No  . Sexual Activity: Yes   Other Topics Concern  . None   Social History Narrative     Current outpatient prescriptions:  .  ALPRAZolam (XANAX) 0.5 MG tablet, Take 0.5-1 tablets (0.25-0.5 mg total) by mouth daily as needed. For anxiety (Patient taking differently: Take 0.25-0.5 mg by mouth 3 (three) times daily as needed. For anxiety), Disp: 60 tablet, Rfl: 2 .  amLODipine-olmesartan (AZOR) 10-40 MG per tablet, Take 1 tablet by mouth daily., Disp: 30 tablet, Rfl: 2 .  aspirin 81 MG chewable tablet, Chew 324 mg by mouth once., Disp: , Rfl:  .  AZOR 10-40 MG per tablet, TAKE 1 TABLET BY MOUTH EVERY DAY FOR  BLOOD PRESSURE, Disp: 90 tablet, Rfl: 3 .  Cholecalciferol (VITAMIN D3) 1000 UNITS CAPS, Take 2 tablets by mouth daily. , Disp: , Rfl:  .  Cyanocobalamin (VITAMIN B-12) 1000 MCG SUBL, Place 1 tablet (1,000 mcg total) under the tongue daily., Disp: 100 tablet, Rfl: 3 .  fluvoxaMINE (LUVOX) 100 MG tablet, Take 1 tablet (100 mg total) by mouth 2 (two) times daily., Disp: 180 tablet, Rfl: 2 .  lansoprazole (PREVACID) 30 MG capsule, TAKE ONE CAPSULE TWICE A DAY, Disp: 180 capsule, Rfl: 3 .  lidocaine (XYLOCAINE) 5 % ointment, Apply 1 application topically as needed., Disp: 35.44 g, Rfl: 1 .  Vitamins-Lipotropics ([employer]S BALANCED B-100) TABS, TAKE 1 TABLET BY MOUTH DAILY., Disp: 100 tablet, Rfl: 3 .  HYDROcodone-homatropine (HYCODAN) 5-1.5 MG/5ML syrup, Take 5 mLs by mouth at bedtime as needed for cough., Disp: 120 mL, Rfl: 0  Current facility-administered medications:  .  methylPREDNISolone acetate (DEPO-MEDROL) injection 20 mg, 20 mg, Intra-Lesional, Once, Aleksei Plotnikov V, MD  EXAM:  Filed Vitals:   08/15/14 1036  BP: 120/80  Pulse: 80  Temp: 98.1 F (36.7 C)    Body mass index is 28.44 kg/(m^2).  GENERAL: vitals reviewed and listed above, alert, oriented, appears well hydrated and in no acute distress  HEENT: atraumatic, conjunttiva mildly erythematous bilat, PERRLA, EOMI, visual acuity grossly intact, no obvious abnormalities on inspection of external nose and ears, normal appearance of ear canals and TMs, clear nasal congestion, mild post oropharyngeal erythema with PND, no tonsillar edema or exudate, no sinus TTP  NECK: no obvious masses on inspection  LUNGS: clear to auscultation bilaterally, no wheezes, rales or rhonchi, good air movement  CV: HRRR, no peripheral edema  MS: moves all extremities without noticeable abnormality  PSYCH: pleasant and cooperative, no obvious depression or anxiety  ASSESSMENT AND PLAN:  Discussed the following assessment and plan:  Viral upper  respiratory infection  -given HPI and exam findings today, a serious bacterial infection or illness is unlikely. We discussed potential etiologies, with VURI being most likely, and advised supportive care and monitoring. We discussed treatment side effects, likely course, antibiotic misuse, transmission, and signs of developing a serious illness. -if eyes worsen or do not improve can call and will do eye drops - but this is unlikely bacterial given  symptoms and exam findings, advised compresses -hycodan cough medication provided per his preference and instructions/risks provided -of course, we advised to return or notify a doctor immediately if symptoms worsen or persist or new concerns arise.   Patient Instructions  BEFORE YOU LEAVE: -work note for today, can return to work  The eye symptoms are likely part of the "cold" (viral infection) and should resolve in several days. Please call back if eye symptoms not resolved in 3-4 days or any of the symptoms we discussed.  INSTRUCTIONS FOR UPPER RESPIRATORY INFECTION:  -plenty of rest and fluids  -nasal saline wash 2-3 times daily (use prepackaged nasal saline or bottled/distilled water if making your own)   -can use AFRIN nasal spray for drainage and nasal congestion - but do NOT use longer then 3-4 days  -can use tylenol (in no history of liver disease) or ibuprofen (if no history of kidney disease, bowel bleeding or significant heart disease) as directed for aches and sorethroat  -in the winter time, using a humidifier at night is helpful (please follow cleaning instructions)  -if you are taking a cough medication - use only as directed, may also try a teaspoon of honey to coat the throat and throat lozenges. If given a cough medication with codeine or hydrocodone or other narcotic please be advised that this contains a strong and  potentially addicting medication. Please follow instructions carefully, take as little as possible and only  use AS NEEDED for severe cough. Discuss potential side effects with your pharmacy. Please do not drive or operate machinery while taking these types of medications. Please do not take other sedating medications, drugs or alcohol while taking this medication without discussing with your doctor.  -for sore throat, salt water gargles can help  -follow up if you have fevers, facial pain, tooth pain, difficulty breathing or are worsening or symptoms persist longer then expected  Upper Respiratory Infection, Adult An upper respiratory infection (URI) is also known as the common cold. It is often caused by a type of germ (virus). Colds are easily spread (contagious). You can pass it to others by kissing, coughing, sneezing, or drinking out of the same glass. Usually, you get better in 1 to 3  weeks.  However, the cough can last for even longer. HOME CARE   Only take medicine as told by your doctor. Follow instructions provided above.  Drink enough water and fluids to keep your pee (urine) clear or pale  yellow.  Get plenty of rest.  Return to work when your temperature is < 100 for 24 hours or as told by your doctor. You may use a face mask and wash your hands to stop your cold from spreading. GET HELP RIGHT AWAY IF:   After the first few days, you feel you are getting worse.  You have questions about your medicine.  You have chills, shortness of breath, or red spit (mucus).  You have pain in the face for more then 1-2 days, especially when you bend forward.  You have a fever, puffy (swollen) neck, pain when you swallow, or white spots in the back of your throat.  You have a bad headache, ear pain, sinus pain, or chest pain.  You have a high-pitched whistling sound when you breathe in and out (wheezing).  You cough up blood.  You have sore muscles or a stiff neck. MAKE SURE YOU:   Understand these instructions.  Will watch your condition.  Will get help right away if you are not  doing well or get worse. Document Released: 09/08/2007 Document Revised: 06/14/2011 Document Reviewed: 06/27/2013 [facility] Patient Information 2015 [place], [place]. This information is not intended to replace advice given to you by your health care provider. Make sure you discuss any questions you have with your health care provider.      Kriste BasqueKIM, Jhonatan Lomeli R.

## 2014-08-15 NOTE — Patient Instructions (Addendum)
BEFORE YOU LEAVE: -work note for today, can return to work  The eye symptoms are likely part of the "cold" (viral infection) and should resolve in several days. Please call back if eye symptoms not resolved in 3-4 days or any of the symptoms we discussed.  INSTRUCTIONS FOR UPPER RESPIRATORY INFECTION:  -plenty of rest and fluids  -nasal saline wash 2-3 times daily (use prepackaged nasal saline or bottled/distilled water if making your own)   -can use AFRIN nasal spray for drainage and nasal congestion - but do NOT use longer then 3-4 days  -can use tylenol (in no history of liver disease) or ibuprofen (if no history of kidney disease, bowel bleeding or significant heart disease) as directed for aches and sorethroat  -in the winter time, using a humidifier at night is helpful (please follow cleaning instructions)  -if you are taking a cough medication - use only as directed, may also try a teaspoon of honey to coat the throat and throat lozenges. If given a cough medication with codeine or hydrocodone or other narcotic please be advised that this contains a strong and  potentially addicting medication. Please follow instructions carefully, take as little as possible and only use AS NEEDED for severe cough. Discuss potential side effects with your pharmacy. Please do not drive or operate machinery while taking these types of medications. Please do not take other sedating medications, drugs or alcohol while taking this medication without discussing with your doctor.  -for sore throat, salt water gargles can help  -follow up if you have fevers, facial pain, tooth pain, difficulty breathing or are worsening or symptoms persist longer then expected  Upper Respiratory Infection, Adult An upper respiratory infection (URI) is also known as the common cold. It is often caused by a type of germ (virus). Colds are easily spread (contagious). You can pass it to others by kissing, coughing, sneezing, or  drinking out of the same glass. Usually, you get better in 1 to 3  weeks.  However, the cough can last for even longer. HOME CARE   Only take medicine as told by your doctor. Follow instructions provided above.  Drink enough water and fluids to keep your pee (urine) clear or pale yellow.  Get plenty of rest.  Return to work when your temperature is < 100 for 24 hours or as told by your doctor. You may use a face mask and wash your hands to stop your cold from spreading. GET HELP RIGHT AWAY IF:   After the first few days, you feel you are getting worse.  You have questions about your medicine.  You have chills, shortness of breath, or red spit (mucus).  You have pain in the face for more then 1-2 days, especially when you bend forward.  You have a fever, puffy (swollen) neck, pain when you swallow, or white spots in the back of your throat.  You have a bad headache, ear pain, sinus pain, or chest pain.  You have a high-pitched whistling sound when you breathe in and out (wheezing).  You cough up blood.  You have sore muscles or a stiff neck. MAKE SURE YOU:   Understand these instructions.  Will watch your condition.  Will get help right away if you are not doing well or get worse. Document Released: 09/08/2007 Document Revised: 06/14/2011 Document Reviewed: 06/27/2013 [facility] MountainExit[facility] Patient Information 2015 [place], [place]. This information is not intended to replace advice given to you by your health care provider. Make sure you  discuss any questions you have with your health care provider.  

## 2014-08-19 ENCOUNTER — Telehealth: Payer: Self-pay | Admitting: Internal Medicine

## 2014-08-19 NOTE — Telephone Encounter (Signed)
Pt wife called back in wanting to know if he was able to be worked in today?

## 2014-08-19 NOTE — Telephone Encounter (Signed)
Ok tomorrow at 1 pm or at 4:15 pm Thx

## 2014-08-19 NOTE — Telephone Encounter (Signed)
Gave msg back to NewcastleLori to call and make appt...Raechel Chute/lmb

## 2014-08-19 NOTE — Telephone Encounter (Signed)
Patient was seen by Dr. Kriste BasqueHannah Kim last Thursday and did not like her diagnosis. He wants to see you asap and I did schedule him your next available which is next Monday. Patient was not happy with that and was hoping to get worked into your schedule asap.

## 2014-08-20 ENCOUNTER — Ambulatory Visit (INDEPENDENT_AMBULATORY_CARE_PROVIDER_SITE_OTHER): Payer: BLUE CROSS/BLUE SHIELD | Admitting: Internal Medicine

## 2014-08-20 ENCOUNTER — Encounter: Payer: Self-pay | Admitting: Internal Medicine

## 2014-08-20 VITALS — BP 130/90 | HR 84 | Wt 195.0 lb

## 2014-08-20 DIAGNOSIS — J209 Acute bronchitis, unspecified: Secondary | ICD-10-CM | POA: Diagnosis not present

## 2014-08-20 DIAGNOSIS — H1033 Unspecified acute conjunctivitis, bilateral: Secondary | ICD-10-CM

## 2014-08-20 MED ORDER — LORATADINE 10 MG PO TABS: 10.0000 mg | ORAL_TABLET | Freq: Every day | ORAL | Status: DC

## 2014-08-20 MED ORDER — ERYTHROMYCIN 5 MG/GM OP OINT: 1.0000 "application " | TOPICAL_OINTMENT | Freq: Four times a day (QID) | OPHTHALMIC | Status: DC

## 2014-08-20 NOTE — Assessment & Plan Note (Signed)
Resolving

## 2014-08-20 NOTE — Assessment & Plan Note (Signed)
Erythro oint QID

## 2014-08-20 NOTE — Progress Notes (Signed)
Subjective:     Eye Pain  The right eye is affected. The current episode started in the past 7 days. The problem has been unchanged. The pain is mild. Associated symptoms include nausea. Pertinent negatives include no blurred vision, double vision, eye redness, fever, photophobia or weakness.  Gastrophageal Reflux He complains of chest pain, choking and nausea. He reports no abdominal pain, no sore throat or no wheezing. Pertinent negatives include no fatigue.       BP Readings from Last 3 Encounters:  08/20/14 130/90  08/15/14 120/80  07/29/14 114/70   Wt Readings from Last 3 Encounters:  08/20/14 195 lb (88.451 kg)  08/15/14 192 lb 11.2 oz (87.408 kg)  04/10/14 195 lb (88.451 kg)       .Review of Systems  Constitutional: Negative for fever, appetite change, fatigue and unexpected weight change.  HENT: Negative for congestion, mouth sores, nosebleeds, sneezing, sore throat, tinnitus, trouble swallowing and voice change.   Eyes: Positive for pain. Negative for blurred vision, double vision, photophobia, redness, itching and visual disturbance.  Respiratory: Positive for choking. Negative for wheezing.   Cardiovascular: Positive for chest pain. Negative for palpitations and leg swelling.  Gastrointestinal: Positive for nausea. Negative for abdominal pain, diarrhea, blood in stool and abdominal distention.  Genitourinary: Negative for frequency, hematuria and difficulty urinating.  Musculoskeletal: Negative for back pain, joint swelling, gait problem and neck pain.  Neurological: Negative for dizziness, tremors, speech difficulty and weakness.  Psychiatric/Behavioral: Negative for suicidal ideas, hallucinations, behavioral problems, sleep disturbance, self-injury, dysphoric mood and agitation. The patient is not nervous/anxious and is not hyperactive.        Objective:   Physical Exam  Constitutional: He is oriented to person, place, and time. He appears well-developed  and well-nourished. No distress.  HENT:  Mouth/Throat: Oropharynx is clear and moist.  Eyes: Conjunctivae are normal. Pupils are equal, round, and reactive to light. Left eye exhibits no discharge.  Hard hearing  Neck: Normal range of motion. No JVD present. No thyromegaly present.  Cardiovascular: Normal rate, regular rhythm, normal heart sounds and intact distal pulses.  Exam reveals no gallop and no friction rub.   No murmur heard. Pulmonary/Chest: Effort normal and breath sounds normal. No respiratory distress. He has no wheezes. He has no rales. He exhibits no tenderness.  Abdominal: Soft. Bowel sounds are normal. He exhibits no distension and no mass. There is no tenderness. There is no rebound and no guarding.  Musculoskeletal: Normal range of motion. He exhibits no edema or tenderness.  Lymphadenopathy:    He has no cervical adenopathy.  Neurological: He is alert and oriented to person, place, and time. He has normal reflexes. No cranial nerve deficit. He exhibits normal muscle tone. Coordination normal.  Skin: Skin is warm and dry. No rash noted.  Psychiatric: He has a normal mood and affect. His behavior is normal. Judgment and thought content normal.  eryth throat    Lab Results  Component Value Date   WBC 4.7 03/08/2014   HGB 15.1 03/08/2014   HCT 43.1 03/08/2014   PLT 252 03/08/2014   GLUCOSE 97 06/27/2012   CHOL 217* 06/27/2012   TRIG 96.0 06/27/2012   HDL 57.30 06/27/2012   LDLDIRECT 140.7 06/27/2012   ALT 20 08/13/2011   AST 21 08/13/2011   NA 138 06/27/2012   K 4.4 06/27/2012   CL 99 06/27/2012   CREATININE 0.9 06/27/2012   BUN 15 06/27/2012   CO2 30 06/27/2012   TSH 1.10  06/27/2012   PSA 0.63 06/27/2012   INR 1.0 10/04/2006        Assessment & Plan:

## 2014-08-20 NOTE — Progress Notes (Signed)
Pre visit review using our clinic review tool, if applicable. No additional management support is needed unless otherwise documented below in the visit note. 

## 2014-08-23 ENCOUNTER — Ambulatory Visit: Payer: BLUE CROSS/BLUE SHIELD | Admitting: Gastroenterology

## 2014-08-26 ENCOUNTER — Ambulatory Visit: Payer: BLUE CROSS/BLUE SHIELD | Admitting: Internal Medicine

## 2014-08-26 DIAGNOSIS — Z0289 Encounter for other administrative examinations: Secondary | ICD-10-CM

## 2014-08-28 ENCOUNTER — Ambulatory Visit (INDEPENDENT_AMBULATORY_CARE_PROVIDER_SITE_OTHER): Payer: BLUE CROSS/BLUE SHIELD | Admitting: Internal Medicine

## 2014-08-28 ENCOUNTER — Encounter: Payer: Self-pay | Admitting: Internal Medicine

## 2014-08-28 VITALS — BP 140/88 | HR 87 | Temp 98.4°F | Wt 193.0 lb

## 2014-08-28 DIAGNOSIS — J029 Acute pharyngitis, unspecified: Secondary | ICD-10-CM | POA: Diagnosis not present

## 2014-08-28 LAB — POCT RAPID STREP A (OFFICE): Rapid Strep A Screen: NEGATIVE

## 2014-08-28 MED ORDER — AZITHROMYCIN 250 MG PO TABS: ORAL_TABLET | ORAL | Status: DC

## 2014-08-28 NOTE — Progress Notes (Signed)
Pre visit review using our clinic review tool, if applicable. No additional management support is needed unless otherwise documented below in the visit note. 

## 2014-08-28 NOTE — Progress Notes (Signed)
   Subjective:   C/o ST, neck pain x 2-3 d Eye infection has resolved  HPI   BP Readings from Last 3 Encounters:  08/28/14 140/88  08/20/14 130/90  08/15/14 120/80   Wt Readings from Last 3 Encounters:  08/28/14 193 lb (87.544 kg)  08/20/14 195 lb (88.451 kg)  08/15/14 192 lb 11.2 oz (87.408 kg)       .Review of Systems  Constitutional: Negative for appetite change and unexpected weight change.  HENT: Negative for congestion, mouth sores, nosebleeds, sneezing, tinnitus, trouble swallowing and voice change.   Eyes: Negative for itching and visual disturbance.  Cardiovascular: Negative for palpitations and leg swelling.  Gastrointestinal: Negative for diarrhea, blood in stool and abdominal distention.  Genitourinary: Negative for frequency, hematuria and difficulty urinating.  Musculoskeletal: Negative for back pain, joint swelling, gait problem and neck pain.  Neurological: Negative for dizziness, tremors, speech difficulty and weakness.  Psychiatric/Behavioral: Negative for suicidal ideas, hallucinations, behavioral problems, sleep disturbance, self-injury, dysphoric mood and agitation. The patient is not nervous/anxious and is not hyperactive.        Objective:   Physical Exam  Constitutional: He is oriented to person, place, and time. He appears well-developed and well-nourished. No distress.  HENT:  Mouth/Throat: Oropharynx is clear and moist.  Eyes: Conjunctivae are normal. Pupils are equal, round, and reactive to light. Left eye exhibits no discharge.  Hard hearing  Neck: Normal range of motion. No JVD present. No thyromegaly present.  Cardiovascular: Normal rate, regular rhythm, normal heart sounds and intact distal pulses.  Exam reveals no gallop and no friction rub.   No murmur heard. Pulmonary/Chest: Effort normal and breath sounds normal. No respiratory distress. He has no wheezes. He has no rales. He exhibits no tenderness.  Abdominal: Soft. Bowel sounds are  normal. He exhibits no distension and no mass. There is no tenderness. There is no rebound and no guarding.  Musculoskeletal: Normal range of motion. He exhibits no edema or tenderness.  Lymphadenopathy:    He has no cervical adenopathy.  Neurological: He is alert and oriented to person, place, and time. He has normal reflexes. No cranial nerve deficit. He exhibits normal muscle tone. Coordination normal.  Skin: Skin is warm and dry. No rash noted.  Psychiatric: He has a normal mood and affect. His behavior is normal. Judgment and thought content normal.  eryth throat, red and swollen uvula    Lab Results  Component Value Date   WBC 4.7 03/08/2014   HGB 15.1 03/08/2014   HCT 43.1 03/08/2014   PLT 252 03/08/2014   GLUCOSE 97 06/27/2012   CHOL 217* 06/27/2012   TRIG 96.0 06/27/2012   HDL 57.30 06/27/2012   LDLDIRECT 140.7 06/27/2012   ALT 20 08/13/2011   AST 21 08/13/2011   NA 138 06/27/2012   K 4.4 06/27/2012   CL 99 06/27/2012   CREATININE 0.9 06/27/2012   BUN 15 06/27/2012   CO2 30 06/27/2012   TSH 1.10 06/27/2012   PSA 0.63 06/27/2012   INR 1.0 10/04/2006        Assessment & Plan:

## 2014-09-16 ENCOUNTER — Ambulatory Visit (INDEPENDENT_AMBULATORY_CARE_PROVIDER_SITE_OTHER): Payer: BLUE CROSS/BLUE SHIELD | Admitting: Internal Medicine

## 2014-09-16 ENCOUNTER — Encounter: Payer: Self-pay | Admitting: Internal Medicine

## 2014-09-16 VITALS — BP 130/72 | HR 68 | Ht 69.0 in | Wt 195.6 lb

## 2014-09-16 DIAGNOSIS — K648 Other hemorrhoids: Secondary | ICD-10-CM | POA: Diagnosis not present

## 2014-09-16 DIAGNOSIS — K601 Chronic anal fissure: Secondary | ICD-10-CM | POA: Diagnosis not present

## 2014-09-16 DIAGNOSIS — L309 Dermatitis, unspecified: Secondary | ICD-10-CM | POA: Diagnosis not present

## 2014-09-16 MED ORDER — NYSTATIN-TRIAMCINOLONE 100000-0.1 UNIT/GM-% EX OINT: 1.0000 "application " | TOPICAL_OINTMENT | Freq: Two times a day (BID) | CUTANEOUS | Status: AC

## 2014-09-16 MED ORDER — DILTIAZEM GEL 2 %: 1.0000 "application " | Freq: Two times a day (BID) | CUTANEOUS | Status: DC

## 2014-09-16 MED ORDER — DILTIAZEM GEL 2 %: 1.0000 "application " | Freq: Two times a day (BID) | CUTANEOUS | Status: AC

## 2014-09-16 NOTE — Patient Instructions (Addendum)
  Today you have been given a handout to read on anal fissure.  We have sent the following medications to your pharmacy for you to pick up at your convenience: Rectal cream for the itching  We are faxing a rx for Diltiazem gel to [facility] off [address]   Follow up with [place] in 2 months, appointment made for 11/18/14 at 3:00pm.    I appreciate the opportunity to care for you. Stan Head, M.D., [facility]

## 2014-09-16 NOTE — Progress Notes (Signed)
   Subjective:    Patient ID: Howard Gray, male    DOB: 13-Mar-1966, 49 y.o.   MRN: [id number] Cc: rectal pain and bleeding HPI  The patient is here for rectal bleeding and possible treatment with hemorrhoid banding. He has had chronic intermittent symptoms x years. Has 2 soft stools a day. Sees bright red blood on toilet paper at times. Has pain with and especially after defecation. Pain is sharp. Also has chronic anal itching. He saw Mike Gip PA-C with his wife and had a colonoscopy scheduled, this visit scheduled and was treated with rectal hydrocortisone. He says he did not hear everything in that appointment and wife handled a lot of that. He is puzzled about this visit somewhat and also did not have the colonoscopy (with Dr. Christella Hartigan) because he was ill with pharyngitis  Medications, allergies, past medical history, past surgical history, family history and social history are reviewed and updated in the EMR.  Review of Systems Hard of hearing    Objective:   Physical Exam BP 148/82 mmHg  Pulse 68  Ht 5\' 9"  (1.753 m)  Wt 195 lb 9.6 oz (88.724 kg)  BMI 28.87 kg/m2  Rectal - + perianal dermatitis - erythema and scaly skin there and in gluteal crease  Tender and induration posterior anal exam ++ spasm, no mass   Anoscopy was performed with the patient in the left lateral decubitus position and showed internal hemorrhoids and a posterior fissure.     Assessment & Plan:  Chronic anal fissure - posterior Will treat with Diltiazem gel, hold off on colonoscopy as prep will aggravate RTC 2 months Educated by me and handout provided  Internal hemorrhoids with bleeding Reassess the bleeding after treatment of fissure Consider banding  Perianal dermatitis Treat with nystatin triamcinolone   cc: Mike Gip, PA-C and Rob Bunting, MD

## 2014-09-18 ENCOUNTER — Encounter: Payer: Self-pay | Admitting: Internal Medicine

## 2014-09-18 DIAGNOSIS — K601 Chronic anal fissure: Secondary | ICD-10-CM | POA: Insufficient documentation

## 2014-09-18 DIAGNOSIS — L309 Dermatitis, unspecified: Secondary | ICD-10-CM | POA: Insufficient documentation

## 2014-09-18 DIAGNOSIS — K648 Other hemorrhoids: Secondary | ICD-10-CM | POA: Insufficient documentation

## 2014-09-18 NOTE — Assessment & Plan Note (Signed)
Will treat with Diltiazem gel, hold off on colonoscopy as prep will aggravate RTC 2 months Educated by me and handout provided

## 2014-09-18 NOTE — Assessment & Plan Note (Signed)
Reassess the bleeding after treatment of fissure Consider banding

## 2014-09-18 NOTE — Assessment & Plan Note (Signed)
Treat with nystatin triamcinolone

## 2014-09-25 ENCOUNTER — Telehealth: Payer: Self-pay | Admitting: Internal Medicine

## 2014-09-25 NOTE — Telephone Encounter (Signed)
I have rescheduled him for 10/17/14 3:00.   I attempted to return his call.  No answer or machine.  I will continue to try and reach the patient

## 2014-09-26 NOTE — Telephone Encounter (Signed)
Patient and his wife have been notified of the new appt date and time

## 2014-10-17 ENCOUNTER — Ambulatory Visit (INDEPENDENT_AMBULATORY_CARE_PROVIDER_SITE_OTHER): Payer: BLUE CROSS/BLUE SHIELD | Admitting: Internal Medicine

## 2014-10-17 ENCOUNTER — Encounter: Payer: Self-pay | Admitting: Internal Medicine

## 2014-10-17 VITALS — BP 112/60 | HR 82 | Ht 68.5 in | Wt 195.5 lb

## 2014-10-17 DIAGNOSIS — L309 Dermatitis, unspecified: Secondary | ICD-10-CM

## 2014-10-17 DIAGNOSIS — K648 Other hemorrhoids: Secondary | ICD-10-CM | POA: Diagnosis not present

## 2014-10-17 DIAGNOSIS — K601 Chronic anal fissure: Secondary | ICD-10-CM

## 2014-10-17 NOTE — Patient Instructions (Addendum)
  HEMORRHOID BANDING PROCEDURE    FOLLOW-UP CARE   1. The procedure you have had should have been relatively painless since the banding of the area involved does not have nerve endings and there is no pain sensation.  The rubber band cuts off the blood supply to the hemorrhoid and the band may fall off as soon as 48 hours after the banding (the band may occasionally be seen in the toilet bowl following a bowel movement). You may notice a temporary feeling of fullness in the rectum which should respond adequately to plain Tylenol or Motrin.  2. Following the banding, avoid strenuous exercise that evening and resume full activity the next day.  A sitz bath (soaking in a warm tub) or bidet is soothing, and can be useful for cleansing the area after bowel movements.     3. To avoid constipation, take two tablespoons of natural wheat bran, natural oat bran, flax, Benefiber or any over the counter fiber supplement and increase your water intake to 7-8 glasses daily.    4. Unless you have been prescribed anorectal medication, do not put anything inside your rectum for two weeks: No suppositories, enemas, fingers, etc.  5. Occasionally, you may have more bleeding than usual after the banding procedure.  This is often from the untreated hemorrhoids rather than the treated one.  Don't be concerned if there is a tablespoon or so of blood.  If there is more blood than this, lie flat with your bottom higher than your head and apply an ice pack to the area. If the bleeding does not stop within a half an hour or if you feel faint, call our office at [phone] or go to the emergency room.  6. Problems are not common; however, if there is a substantial amount of bleeding, severe pain, chills, fever or difficulty passing urine (very rare) or other problems, you should call us at [phone] or report to the nearest emergency room.  7. Do not stay seated continuously for more than 2-3 hours for a day or  two after the procedure.  Tighten your buttock muscles 10-15 times every two hours and take 10-15 deep breaths every 1-2 hours.  Do not spend more than a few minutes on the toilet if you cannot empty your bowel; instead re-visit the toilet at a later time.    We have sent the following medications to your pharmacy for you to pick up at your convenience: Nystatin ointment has been sent to [facility] on [address]  (He never picked up last rx so they put it back on the shelf, they refilled today from old rx)   Please use the Diltiazem twice a day.    We will see you at your next appointment August 29th at 3:15PM.   I appreciate the opportunity to care for you. Stan Headarl Gessner, MD, [facility]

## 2014-10-17 NOTE — Progress Notes (Signed)
   Subjective:    Patient ID: Howard Gray, male    DOB: 11/30/1965, 49 y.o.   MRN: [id number] Cc: f/u anal fissure and hemorrhoids HPI Less pain - using diltiazem gel but qd mostly Still with itching and rectal bleeding No constipation, straining or diarrhea He did not pick-up Nystatin-triamcinolone ointment (different pharmacy) Medications, allergies, past medical history, past surgical history, family history and social history are reviewed and updated in the EMR.  Review of Systems As above, decreased hearing Moving to [place] next week    Objective:   Physical Exam BP 112/60 mmHg  Pulse 82  Ht 5' 8.5" (1.74 m)  Wt 195 lb 8 oz (88.678 kg)  BMI 29.29 kg/m2  Rectal exam: Persistent perianal dermatitis - posterior gluteal crease Long spastic anal canal, less tender, still some posterior w/ induration, fissure  Anoscopy: Gr 2 internal hemorrhoids RA and LL, Gr 1 RP  PROCEDURE NOTE: The patient presents with symptomatic grade 2 hemorrhoids, requesting rubber band ligation of his/her hemorrhoidal disease.  All risks, benefits and alternative forms of therapy were described and informed consent was obtained.   The anorectum was pre-medicated with 0.125% NTG and 5% liodcaine The decision was made to band the LL internal hemorrhoid, and the [facility] System was used to perform band ligation without complication. RA attempt was not successful Digital anorectal examination was then performed to assure proper positioning of the band, and to adjust the banded tissue as required.  The patient was discharged home without pain or other issues.  Dietary and behavioral recommendations were given and along with follow-up instructions.     The following adjunctive treatments were recommended:  Continue diltiazem gel for anal fissure and take BID  The patient will return in about 1 monthfor  follow-up and possible additional banding as required. No complications were encountered and the  patient tolerated the procedure well.      Assessment & Plan:  Chronic anal fissure - posterior Continue diltiazem gel - BID  Internal hemorrhoids with bleeding Banded LL today RTC several weeks   Perianal dermatitis Obtain triamcinolone/nystatin   Moving to [place] ,[place] but will return here for f/u Tx

## 2014-10-17 NOTE — Assessment & Plan Note (Signed)
Continue diltiazem gel - BID

## 2014-10-17 NOTE — Assessment & Plan Note (Signed)
Banded LL today RTC several weeks

## 2014-10-17 NOTE — Assessment & Plan Note (Signed)
Obtain triamcinolone/nystatin

## 2014-11-18 ENCOUNTER — Ambulatory Visit: Payer: BLUE CROSS/BLUE SHIELD | Admitting: Internal Medicine

## 2014-12-02 ENCOUNTER — Encounter: Payer: BLUE CROSS/BLUE SHIELD | Admitting: Internal Medicine

## 2014-12-09 ENCOUNTER — Other Ambulatory Visit: Payer: Self-pay | Admitting: Internal Medicine

## 2015-01-16 ENCOUNTER — Other Ambulatory Visit: Payer: Self-pay | Admitting: Internal Medicine

## 2015-02-05 ENCOUNTER — Other Ambulatory Visit: Payer: Self-pay | Admitting: *Deleted

## 2015-02-05 MED ORDER — AMLODIPINE-OLMESARTAN 10-40 MG PO TABS: 1.0000 | ORAL_TABLET | Freq: Every day | ORAL | Status: DC

## 2015-02-05 MED ORDER — LANSOPRAZOLE 30 MG PO CPDR: 30.0000 mg | DELAYED_RELEASE_CAPSULE | Freq: Two times a day (BID) | ORAL | Status: AC

## 2015-02-05 MED ORDER — ALPRAZOLAM 0.5 MG PO TABS: 0.2500 mg | ORAL_TABLET | Freq: Every day | ORAL | Status: AC | PRN

## 2015-04-19 ENCOUNTER — Other Ambulatory Visit: Payer: Self-pay | Admitting: Internal Medicine

## 2015-09-17 ENCOUNTER — Other Ambulatory Visit: Payer: Self-pay | Admitting: Internal Medicine

## 2015-09-21 ENCOUNTER — Other Ambulatory Visit: Payer: Self-pay | Admitting: Internal Medicine

## 2016-02-19 ENCOUNTER — Other Ambulatory Visit: Payer: Self-pay | Admitting: Internal Medicine

## 2016-03-22 ENCOUNTER — Other Ambulatory Visit: Payer: Self-pay | Admitting: Internal Medicine

## 2016-05-05 ENCOUNTER — Other Ambulatory Visit: Payer: Self-pay | Admitting: Internal Medicine

## 2016-09-17 ENCOUNTER — Encounter: Payer: Self-pay | Admitting: Gastroenterology

## 2019-04-03 ENCOUNTER — Telehealth: Payer: Self-pay

## 2019-04-03 NOTE — Telephone Encounter (Signed)
Patient would like to have Medical Release form emailed to him at [email or web address].  Once medical release form is returned back to our office, he would like to have medical records emailed and faxed.  CB# [phone].  Please advise.  Thank you.

## 2019-04-04 NOTE — Telephone Encounter (Signed)
Emailed release form 

## 2021-02-04 ENCOUNTER — Telehealth: Payer: Self-pay | Admitting: Internal Medicine

## 2021-02-04 NOTE — Telephone Encounter (Signed)
Patient is now living in [place]   Patient needs all medical records related to CT Scans, Stress Test & any other imaging & labs sent to current physician  Patient says current physician found a mass & they need all previous medical records  Please email to: [email or web address] & also fax [phone]

## 2021-02-05 NOTE — Telephone Encounter (Signed)
Notified wife we have faxed over last CT /xray that was ordered by Dr. Posey Rea back in 2012. Do not see stress test that was done. Gave wife health information # to get records.Marland KitchenRaechel Chute
